# Patient Record
Sex: Female | Born: 1950
Health system: Southern US, Community
[De-identification: ages and names within clinical notes are randomized; demographics above are authoritative.]

## PROBLEM LIST (undated history)

## (undated) DIAGNOSIS — M329 Systemic lupus erythematosus, unspecified: Secondary | ICD-10-CM

## (undated) DIAGNOSIS — B977 Papillomavirus as the cause of diseases classified elsewhere: Secondary | ICD-10-CM

## (undated) DIAGNOSIS — M810 Age-related osteoporosis without current pathological fracture: Secondary | ICD-10-CM

## (undated) DIAGNOSIS — D649 Anemia, unspecified: Secondary | ICD-10-CM

## (undated) DIAGNOSIS — I10 Essential (primary) hypertension: Secondary | ICD-10-CM

## (undated) DIAGNOSIS — IMO0002 Reserved for concepts with insufficient information to code with codable children: Secondary | ICD-10-CM

## (undated) DIAGNOSIS — M1990 Unspecified osteoarthritis, unspecified site: Secondary | ICD-10-CM

## (undated) HISTORY — PX: CYSTOCELE REPAIR: SHX163

## (undated) HISTORY — DX: Papillomavirus as the cause of diseases classified elsewhere: B97.7

## (undated) HISTORY — DX: Anemia, unspecified: D64.9

## (undated) HISTORY — DX: Age-related osteoporosis without current pathological fracture: M81.0

---

## 1981-05-01 HISTORY — PX: TUBAL LIGATION: SHX77

## 1999-04-01 ENCOUNTER — Encounter: Payer: Self-pay | Admitting: Emergency Medicine

## 1999-04-01 ENCOUNTER — Emergency Department (HOSPITAL_COMMUNITY): Admission: EM | Admit: 1999-04-01 | Discharge: 1999-04-01 | Payer: Self-pay | Admitting: Emergency Medicine

## 2000-03-01 ENCOUNTER — Ambulatory Visit (HOSPITAL_COMMUNITY): Admission: RE | Admit: 2000-03-01 | Discharge: 2000-03-01 | Payer: Self-pay | Admitting: Pulmonary Disease

## 2000-03-01 ENCOUNTER — Encounter: Payer: Self-pay | Admitting: Pulmonary Disease

## 2000-10-23 ENCOUNTER — Emergency Department (HOSPITAL_COMMUNITY): Admission: EM | Admit: 2000-10-23 | Discharge: 2000-10-23 | Payer: Self-pay | Admitting: Emergency Medicine

## 2001-08-16 ENCOUNTER — Encounter: Payer: Self-pay | Admitting: Pulmonary Disease

## 2001-08-16 ENCOUNTER — Ambulatory Visit (HOSPITAL_COMMUNITY): Admission: RE | Admit: 2001-08-16 | Discharge: 2001-08-16 | Payer: Self-pay | Admitting: Pulmonary Disease

## 2001-10-24 ENCOUNTER — Encounter: Payer: Self-pay | Admitting: Orthopedic Surgery

## 2001-10-24 ENCOUNTER — Encounter: Admission: RE | Admit: 2001-10-24 | Discharge: 2001-10-24 | Payer: Self-pay | Admitting: Orthopedic Surgery

## 2003-05-02 HISTORY — PX: TOTAL HIP ARTHROPLASTY: SHX124

## 2003-09-23 ENCOUNTER — Inpatient Hospital Stay (HOSPITAL_COMMUNITY): Admission: RE | Admit: 2003-09-23 | Discharge: 2003-09-27 | Payer: Self-pay | Admitting: Orthopedic Surgery

## 2004-01-04 ENCOUNTER — Emergency Department (HOSPITAL_COMMUNITY): Admission: EM | Admit: 2004-01-04 | Discharge: 2004-01-04 | Payer: Self-pay | Admitting: Family Medicine

## 2005-08-22 ENCOUNTER — Emergency Department (HOSPITAL_COMMUNITY): Admission: EM | Admit: 2005-08-22 | Discharge: 2005-08-22 | Payer: Self-pay | Admitting: Family Medicine

## 2006-07-17 ENCOUNTER — Other Ambulatory Visit: Admission: RE | Admit: 2006-07-17 | Discharge: 2006-07-17 | Payer: Self-pay | Admitting: Obstetrics and Gynecology

## 2007-08-23 ENCOUNTER — Emergency Department (HOSPITAL_COMMUNITY): Admission: EM | Admit: 2007-08-23 | Discharge: 2007-08-23 | Payer: Self-pay | Admitting: Emergency Medicine

## 2008-03-17 ENCOUNTER — Emergency Department (HOSPITAL_COMMUNITY): Admission: EM | Admit: 2008-03-17 | Discharge: 2008-03-17 | Payer: Self-pay | Admitting: Emergency Medicine

## 2008-08-29 ENCOUNTER — Emergency Department (HOSPITAL_COMMUNITY): Admission: EM | Admit: 2008-08-29 | Discharge: 2008-08-30 | Payer: Self-pay | Admitting: Emergency Medicine

## 2008-09-09 ENCOUNTER — Ambulatory Visit (HOSPITAL_COMMUNITY): Admission: RE | Admit: 2008-09-09 | Discharge: 2008-09-09 | Payer: Self-pay | Admitting: Pulmonary Disease

## 2008-09-18 ENCOUNTER — Ambulatory Visit (HOSPITAL_COMMUNITY): Admission: RE | Admit: 2008-09-18 | Discharge: 2008-09-18 | Payer: Self-pay | Admitting: Pulmonary Disease

## 2008-11-09 ENCOUNTER — Ambulatory Visit (HOSPITAL_COMMUNITY): Admission: RE | Admit: 2008-11-09 | Discharge: 2008-11-09 | Payer: Self-pay | Admitting: Pulmonary Disease

## 2010-08-09 LAB — URINALYSIS, ROUTINE W REFLEX MICROSCOPIC
Bilirubin Urine: NEGATIVE
Glucose, UA: NEGATIVE mg/dL
Hgb urine dipstick: NEGATIVE
Ketones, ur: NEGATIVE mg/dL
Leukocytes, UA: NEGATIVE
Protein, ur: 30 mg/dL — AB
Specific Gravity, Urine: 1.021 (ref 1.005–1.030)

## 2010-08-09 LAB — CBC
HCT: 32.3 % — ABNORMAL LOW (ref 36.0–46.0)
Hemoglobin: 10.7 g/dL — ABNORMAL LOW (ref 12.0–15.0)
MCHC: 33.3 g/dL (ref 30.0–36.0)
MCV: 89.2 fL (ref 78.0–100.0)
Platelets: 243 10*3/uL (ref 150–400)
RBC: 3.62 MIL/uL — ABNORMAL LOW (ref 3.87–5.11)
RDW: 14.6 % (ref 11.5–15.5)
WBC: 10.8 10*3/uL — ABNORMAL HIGH (ref 4.0–10.5)

## 2010-08-09 LAB — POCT CARDIAC MARKERS
CKMB, poc: <1 ng/mL — ABNORMAL LOW (ref 1.0–8.0)
CKMB, poc: <1 ng/mL — ABNORMAL LOW (ref 1.0–8.0)
CKMB, poc: <1 ng/mL — ABNORMAL LOW (ref 1.0–8.0)
Myoglobin, poc: 45.5 ng/mL (ref 12–200)
Myoglobin, poc: 47.3 ng/mL (ref 12–200)
Troponin i, poc: <0.05 ng/mL (ref 0.00–0.09)
Troponin i, poc: <0.05 ng/mL (ref 0.00–0.09)

## 2010-08-09 LAB — DIFFERENTIAL
Basophils Absolute: 0 10*3/uL (ref 0.0–0.1)
Basophils Relative: 0 % (ref 0–1)
Eosinophils Absolute: 0 10*3/uL (ref 0.0–0.7)
Eosinophils Relative: 0 % (ref 0–5)
Lymphocytes Relative: 11 % — ABNORMAL LOW (ref 12–46)
Lymphs Abs: 1.1 10*3/uL (ref 0.7–4.0)
Monocytes Absolute: 0.7 10*3/uL (ref 0.1–1.0)
Monocytes Relative: 7 % (ref 3–12)
Neutro Abs: 8.9 10*3/uL — ABNORMAL HIGH (ref 1.7–7.7)
Neutrophils Relative %: 82 % — ABNORMAL HIGH (ref 43–77)

## 2010-08-09 LAB — COMPREHENSIVE METABOLIC PANEL
AST: 18 U/L (ref 0–37)
Albumin: 3.6 g/dL (ref 3.5–5.2)
Alkaline Phosphatase: 54 U/L (ref 39–117)
Calcium: 8.9 mg/dL (ref 8.4–10.5)
Chloride: 102 mEq/L (ref 96–112)
GFR calc non Af Amer: 48 mL/min — ABNORMAL LOW (ref >60)
Glucose, Bld: 98 mg/dL (ref 70–99)
Potassium: 3.6 mEq/L (ref 3.5–5.1)
Sodium: 138 mEq/L (ref 135–145)

## 2010-08-09 LAB — D-DIMER, QUANTITATIVE: D-Dimer, Quant: 1.29 ug/mL-FEU — ABNORMAL HIGH (ref 0.00–0.48)

## 2010-08-09 LAB — LIPASE, BLOOD: Lipase: 28 U/L (ref 11–59)

## 2010-09-16 NOTE — Op Note (Signed)
NAMEWENDIE, Lori Riley                        ACCOUNT NO.:  [id number]   MEDICAL RECORD NO.:  [id number]                   PATIENT TYPE:  INP   LOCATION:  5027                                 FACILITY:  [facility]   PHYSICIAN:  Robert A. Thurston Hole, M.D.              DATE OF BIRTH:  08/21/1950   DATE OF PROCEDURE:  09/23/2003  DATE OF DISCHARGE:                                 OPERATIVE REPORT   PREOPERATIVE DIAGNOSIS:  Right hip degenerative joint disease/lupus.   POSTOPERATIVE DIAGNOSIS:  Right hip degenerative joint disease/lupus.   PROCEDURE:  Right total hip replacement using Osteonix ceramic press fit  total hip system with acetabulum 52 press fit Trident acetabulum with two  locking screws and ceramic insert.  Femoral component 7 press fit Secure-Fit  Plus with +0 by 32 mm ceramic head and 13 mm distal tip.   SURGEON:  Elana Alm. Thurston Hole, M.D.   ASSISTANT:  Julien Girt, P.A.   ANESTHESIA:  General.   OPERATIVE TIME:  1 hour 15 minutes.   ESTIMATED BLOOD LOSS:  350 mL.   COMPLICATIONS:  None.   DESCRIPTION OF PROCEDURE:  Ms. Shankman was brought to the operating room on  Sep 23, 2003, and placed on the operating table in supine position.  She  received Ancef 1 gram IV preoperatively for prophylaxis.  After being placed  under general anesthesia, she had a Foley catheter placed under sterile  conditions.  Her right hip was examined.  She had flexion to 90, extension  to 0, internal and external rotation of 20 degrees.  Leg lengths were  approximately equal.  Pulses were 2+ and symmetric.  After being placed  under general anesthesia, she was then turned to the right lateral up  decubitus position and secured on the bed with a Mark frame.  Her right hip  and leg was then prepped with DuraPrep and draped using sterile technique.   Initially, through a 15 cm posterolateral greater trochanteric incision  initial exposure was made.  The underlying subcutaneous tissues were  incised  in line with the skin incision.  The iliotibial band and gluteus maximus  fascia was incised longitudinally revealing the underlying sciatic nerve  which was carefully protected.  The short external rotators of the hip and  hip capsule were released off the femoral neck insertion and tagged.  The  hip was then posteriorly dislocated.  She was found to have significant  grade 3 and 4 chondromalacia of the femoral head and the acetabulum.  A  femoral neck cut was made 1.5 to 2 cm above the lesser trochanter in the  appropriate amount of anteversion and inclination.  The acetabulum was then  carefully exposed with retractors.  Degenerative labrum was removed.  After  this was done, sequential acetabular reaming was carried in the appropriate  amount of anteversion and abduction and inclination up to a 52 mm size which  was found  to be an excellent fit and then a 52 mm trial cup was placed and  that also gave an excellent fit.  After this was removed, then the actual 52  mm Trident press fit PSL cup was hammered into position in the appropriate  amount of anteversion and abduction and inclination with an excellent press  fit.  It was further secured in place with two separate locking screws, one  at the 12 o'clock and one at the 11 o'clock position.  After this was done,  then the ceramic insert was placed in position and locked in place with an  excellent Morris taper fit.  After this was done, the proximal femur was  exposed.  Sequential reaming was carried out up to a #7 size with axial  reamers followed by broaches to a #7 broach and with a #7 broach in place  and a +0 by 32 mm trial head, this was found to give an excellent fit.  The  hip was reduced and taken through a full range of motion and found to be  stable and leg length equal.  This was then removed.  Distal reaming was  carried out to a 13.5 mm size.  After this was done, then the actual #7  Secure-Fit Plus prosthesis  with a 13 mm distal tip was hammered into  position with an excellent press fit.  A +0 by 32 mm ceramic head was placed  on the femoral neck and hammered into position with an excellent Morris  taper fit.  The hip was then reduced, taken through a full range of motion,  and found to be stable and leg lengths equal.  At this point, it was felt  that all the components were of excellent size, fit, and stability.  The hip  wound was further irrigated with saline solution.  The short external  rotators of the hip and hip capsule were then reattached to their femoral  neck insertion through two drill holes in the greater trochanter.  The  iliotibial band and gluteus maximus fascia was closed with #1 Ethibond  suture.  The subcutaneous tissues were closed with 0 and 2-0 Vicryl.  The  skin was closed with skin staples.  Sterile dressings were applied.  A hip  abduction pillow was applied.  The patient was then turned supine and  checked for leg lengths which were equal, rotation was equal, and pulses  were 2+ and symmetric.  She was then awakened, extubated, and taken to the  recovery room in stable condition.  Needle and sponge counts were correct x  2 at the end of the case.                                               Robert A. Thurston Hole, M.D.    RAW/MEDQ  D:  09/23/2003  T:  09/23/2003  Job:  [id number]

## 2010-09-16 NOTE — Discharge Summary (Signed)
Lori Riley, Lori Riley                        ACCOUNT NO.:  [id number]   MEDICAL RECORD NO.:  [id number]                   PATIENT TYPE:  INP   LOCATION:  5027                                 FACILITY:  [facility]   PHYSICIAN:  Robert A. Thurston Hole, M.D.              DATE OF BIRTH:  Oct 03, 1950   DATE OF ADMISSION:  09/23/2003  DATE OF DISCHARGE:  09/27/2003                                 DISCHARGE SUMMARY   ADMITTING DIAGNOSES:  1. End-stage degenerative joint disease right hip.  2. Lupus.  3. Osteoarthritis.   DISCHARGE DIAGNOSES:  1. End-stage degenerative joint disease right hip.  2. Lupus.  3. Osteoarthritis.  4. Constipation.   HISTORY OF PRESENT ILLNESS:  The patient is a 60 year old white female who  has history of end-stage DJD of both hips secondary to her lupus.  Her right  is more painful than her left.  She has elected to undergo a right total hip  replacement.  She understands the risks, benefits, and possible  complications of it and is without question.   PROCEDURES IN-HOUSE:  On Sep 23, 2003 the patient underwent a right total  hip replacement by Dr. Thurston Hole.  She tolerated the procedure well.   She was admitted postoperatively for pain control, DVT prophylaxis, and  physical therapy.  Postoperative day #2 hemoglobin was 12.1, her INR was  1.1.  Her UA did show bacteria in her urine.  She was started on Cipro 500  mg once a day.  She is touchdown weightbearing.  Her PCA was discontinued.  Physical therapy was commenced.  Postoperative day #2 the patient progressed  well with physical therapy, touchdown weightbearing.  Her hemoglobin was  10.6.  She was metabolically stable.  Her surgical wound was well  approximated.  Her IV was discontinued.  Postoperative day #3 hemoglobin was  9.8, her temperature was 101.2.  She had a slight amount of serous drainage.  Her pain is well controlled.  Postoperative day #4 the patient was doing  very well.  Tmax was 99.9.  Her INR was  2.4.  Her surgical wound was well  approximated with slight serous drainage.  She had a BM.  She was discharged  home in stable condition, touchdown weightbearing, on a regular diet, on K-  Dur 20 mEq one p.o. b.i.d. x1 week, Percocet 5/325 one to two q.4-6h. p.r.n.  pain, Robaxin 500 mg one tablet q.6h. p.r.n. muscle spasm, Coumadin 2.5 mg  one tablet daily, prednisone 5 mg one tablet daily, Colace 100 mg one tablet  twice daily.  Senokot-S two tablets before dinner.  She has been instructed  to call with increased redness, increased drainage, increased pain, or a  temperature greater than 101.  She is to continue on her Cipro 500 mg one  p.o. b.i.d. x3 more days - she has this at home.      Kirstin Shepperson, P.A.  Robert A. Thurston Hole, M.D.    KS/MEDQ  D:  11/24/2003  T:  11/24/2003  Job:  [id number]

## 2010-10-31 ENCOUNTER — Inpatient Hospital Stay (INDEPENDENT_AMBULATORY_CARE_PROVIDER_SITE_OTHER)
Admission: RE | Admit: 2010-10-31 | Discharge: 2010-10-31 | Disposition: A | Discharge status: Home / Self Care | Payer: BC Managed Care – PPO | Source: Ambulatory Visit | Attending: Family Medicine | Admitting: Family Medicine

## 2010-10-31 DIAGNOSIS — L03019 Cellulitis of unspecified finger: Secondary | ICD-10-CM

## 2010-11-07 IMAGING — CT CT ANGIO CHEST
2 of 6 series · 19 of 46 positions shown · IV contrast (APPLIED)
Comparison: None

CLINICAL DATA: Right-sided chest pain.  Shortness of breath.
Elevated D-dimer.

CT ANGIOGRAPHY CHEST WITH CONTRAST
TECHNIQUE: Multidetector CT imaging of the chest was performed
using the standard protocol during bolus administration of
intravenous contrast. Multiplanar CT image reconstructions
including MIPs were obtained to evaluate the vascular anatomy.
Contrast: 80 ml [id number]-[id number]

[Series 6: thins · axial · 0.57mm/px · z∈[-254,-46]mm · 16 of 228 slices shown]
[im 10/228  lung]
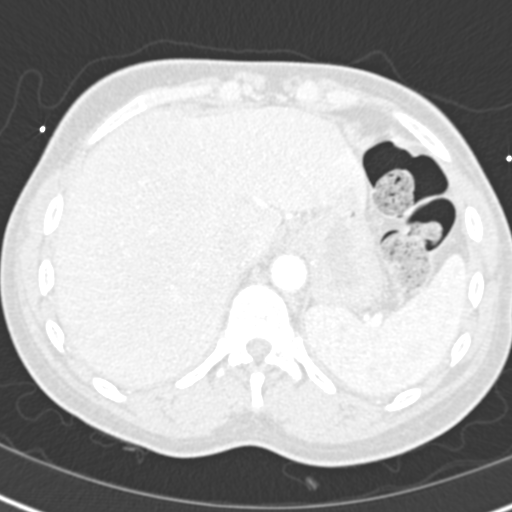
[im 30/228  soft-tissue]
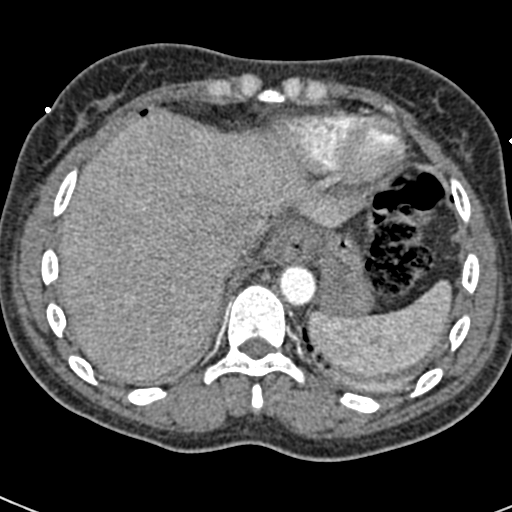
[im 40/228  lung]
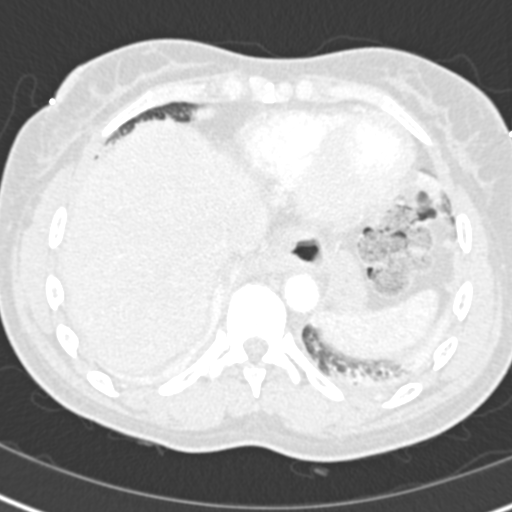
[im 50/228  soft-tissue]
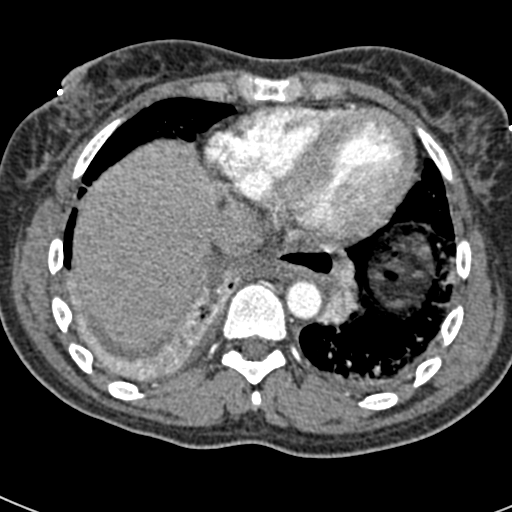
[im 70/228  lung]
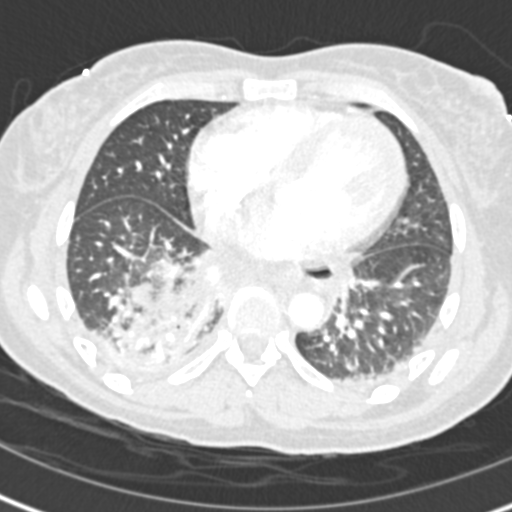
[im 79/228  soft-tissue]
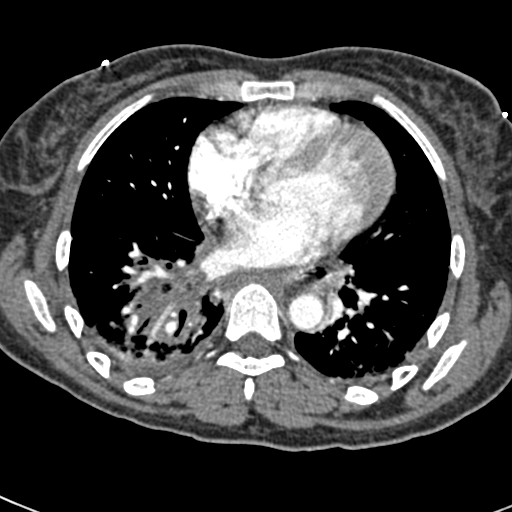
[im 89/228  lung]
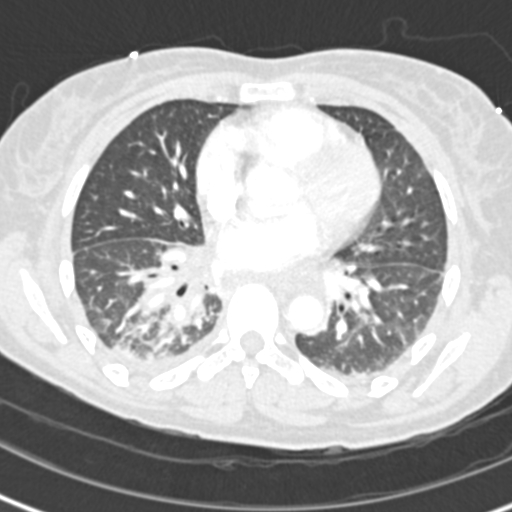
[im 109/228  soft-tissue]
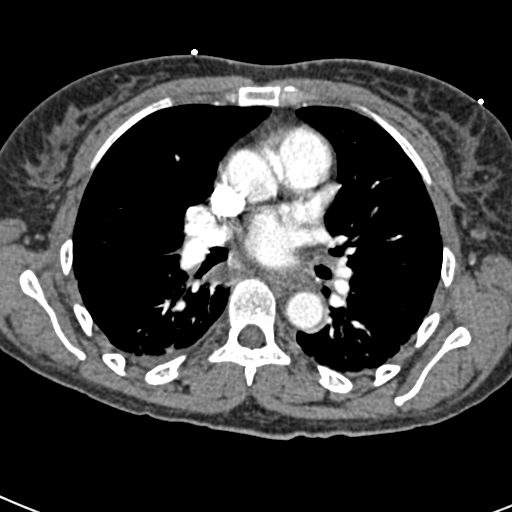
[im 119/228  lung]
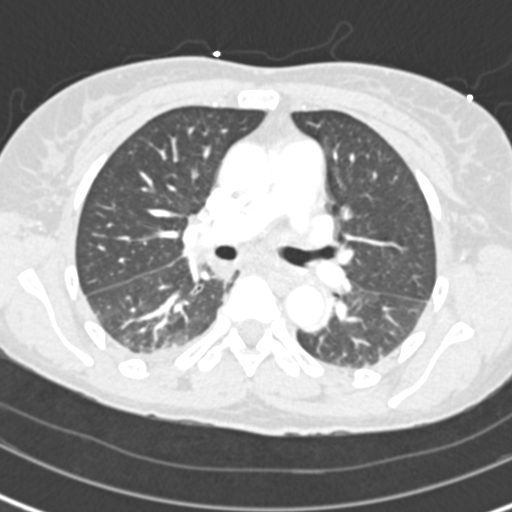
[im 139/228  soft-tissue]
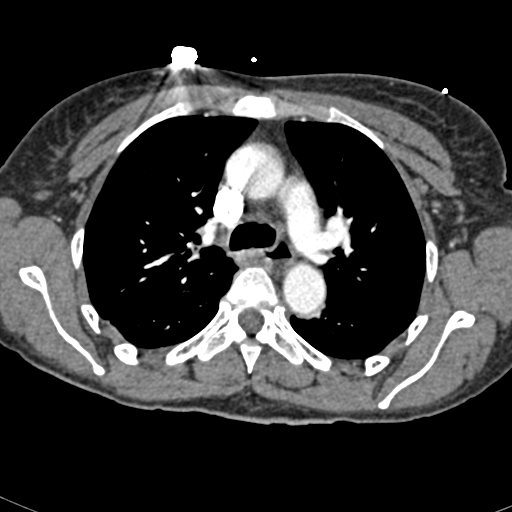
[im 149/228  lung]
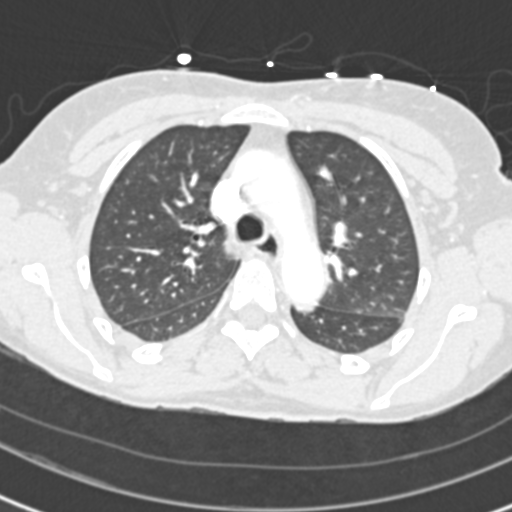
[im 158/228  soft-tissue]
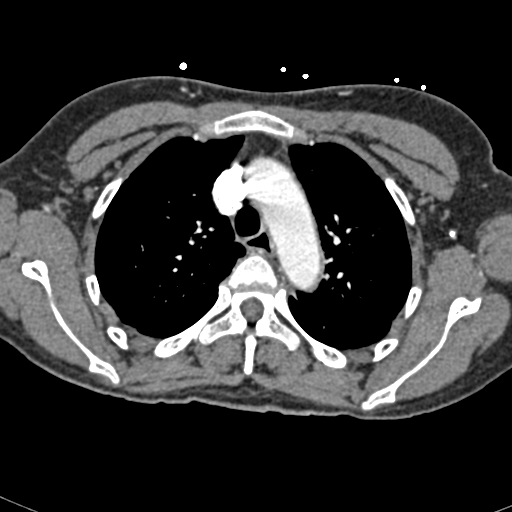
[im 178/228  lung]
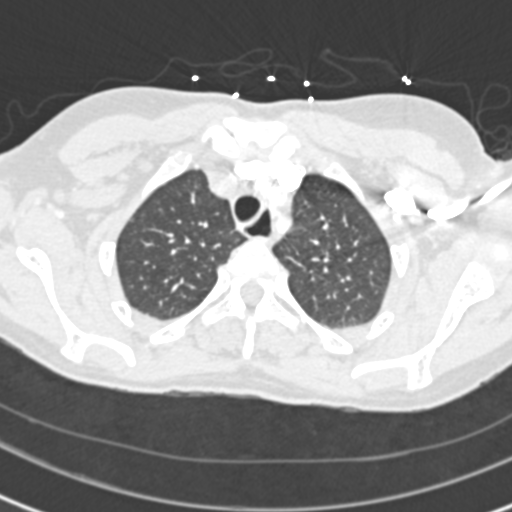
[im 188/228  soft-tissue]
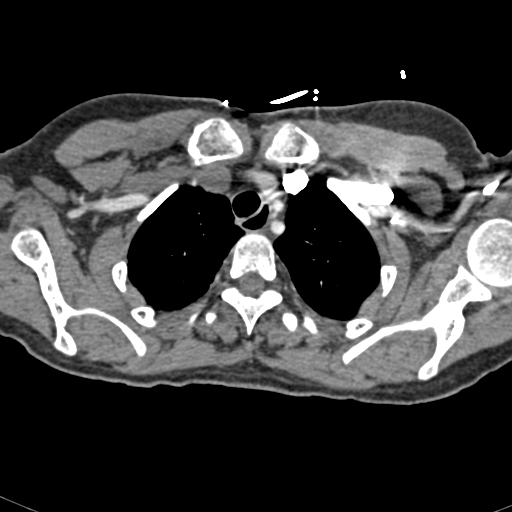
[im 198/228  lung]
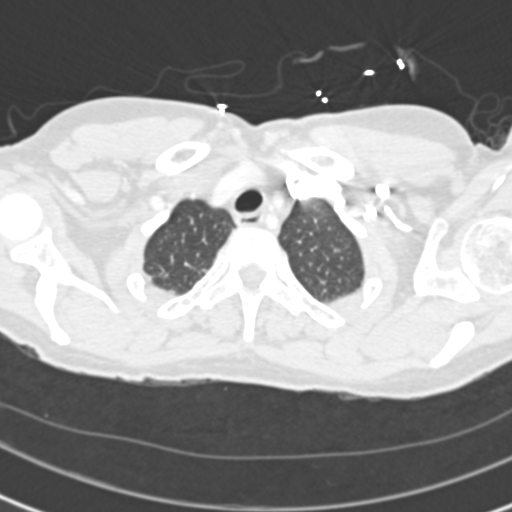
[im 218/228  soft-tissue]
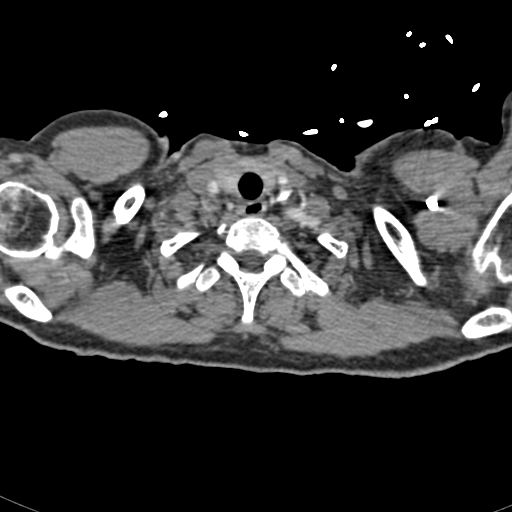

[Series 602: coronal chest · coronal · 0.57mm/px · 3 of 90 slices shown]
[im 23/90  soft-tissue]
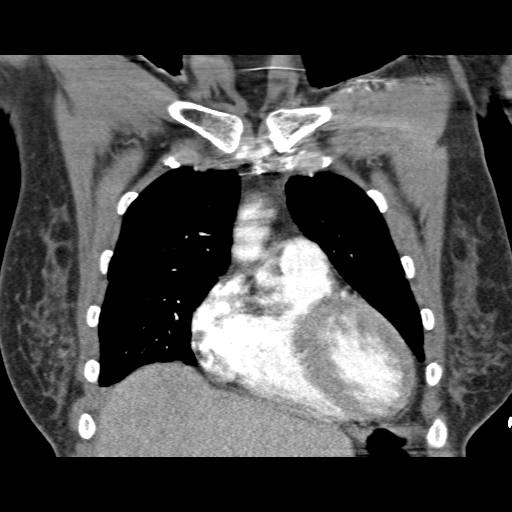
[im 45/90  soft-tissue]
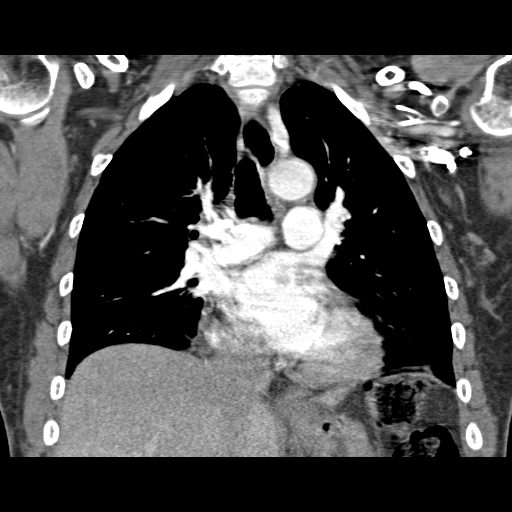
[im 67/90  soft-tissue]
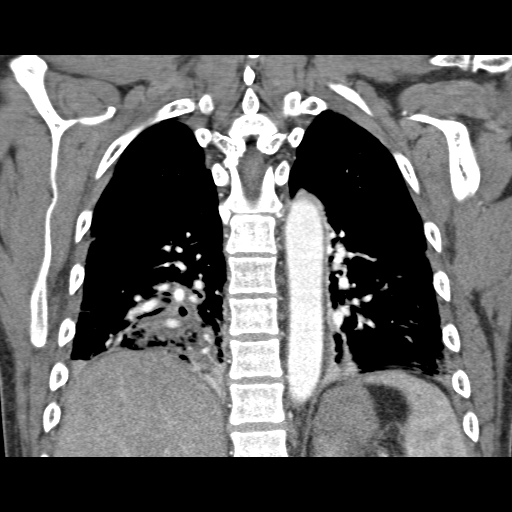

[19 of 46 positions shown; findings below may reference images not displayed]

FINDINGS: Satisfactory opacification of pulmonary arteries is
seen, and there is no evidence of acute pulmonary emboli.  There is
no evidence of thoracic aortic aneurysm or dissection.

Confluent infiltrate is seen in the right lower lobe with central
air bronchograms, suspicious for pneumonia.  There is no evidence
of centrally obstructing mass or lymphadenopathy.  Tiny pleural
effusions are noted bilaterally.  A small hiatal hernia is also
noted.

Review of the MIP images confirms the above findings.
IMPRESSION: 1.  No evidence of acute pulmonary embolism.
2.  Confluent right lower lobe infiltrate, suspicious for
pneumonia.  Post-treatment radiographic followup is recommended to
confirm resolution.
3.  Tiny bilateral pleural effusions.
4.  Small hiatal hernia.

## 2010-11-15 ENCOUNTER — Other Ambulatory Visit (HOSPITAL_COMMUNITY): Payer: Self-pay | Admitting: Pulmonary Disease

## 2010-11-15 DIAGNOSIS — N289 Disorder of kidney and ureter, unspecified: Secondary | ICD-10-CM

## 2010-11-17 ENCOUNTER — Ambulatory Visit (HOSPITAL_COMMUNITY)
Admission: RE | Admit: 2010-11-17 | Discharge: 2010-11-17 | Disposition: A | Discharge status: Home / Self Care | Payer: BC Managed Care – PPO | Source: Ambulatory Visit | Attending: Pulmonary Disease | Admitting: Pulmonary Disease

## 2010-11-17 DIAGNOSIS — R319 Hematuria, unspecified: Secondary | ICD-10-CM | POA: Insufficient documentation

## 2010-11-17 DIAGNOSIS — N289 Disorder of kidney and ureter, unspecified: Secondary | ICD-10-CM | POA: Insufficient documentation

## 2010-12-28 ENCOUNTER — Other Ambulatory Visit: Payer: Self-pay | Admitting: Pulmonary Disease

## 2010-12-28 DIAGNOSIS — M169 Osteoarthritis of hip, unspecified: Secondary | ICD-10-CM

## 2010-12-29 ENCOUNTER — Ambulatory Visit
Admission: RE | Admit: 2010-12-29 | Discharge: 2010-12-29 | Disposition: A | Discharge status: Home / Self Care | Payer: BC Managed Care – PPO | Source: Ambulatory Visit | Attending: Pulmonary Disease | Admitting: Pulmonary Disease

## 2010-12-29 DIAGNOSIS — M169 Osteoarthritis of hip, unspecified: Secondary | ICD-10-CM

## 2010-12-29 MED ORDER — IOHEXOL 180 MG/ML  SOLN
1.0000 mL | Freq: Once | INTRAMUSCULAR | Status: AC | PRN
  Administered 2010-12-29: 1 mL via INTRA_ARTICULAR

## 2010-12-29 MED ORDER — METHYLPREDNISOLONE ACETATE 40 MG/ML INJ SUSP (RADIOLOG
120.0000 mg | Freq: Once | INTRAMUSCULAR | Status: AC
  Administered 2010-12-29: 120 mg via INTRA_ARTICULAR

## 2011-02-01 LAB — COMPREHENSIVE METABOLIC PANEL
Albumin: 3.5
BUN: 13
Calcium: 9.2
Creatinine, Ser: 0.83
GFR calc Af Amer: >60
Total Bilirubin: 0.8
Total Protein: 7.4

## 2011-02-01 LAB — URINALYSIS, ROUTINE W REFLEX MICROSCOPIC
Glucose, UA: NEGATIVE
Hgb urine dipstick: NEGATIVE
Ketones, ur: NEGATIVE
Nitrite: NEGATIVE
Protein, ur: 30 — AB
Urobilinogen, UA: 1
pH: 6

## 2011-02-01 LAB — CBC
HCT: 32.3 — ABNORMAL LOW
MCV: 86
Platelets: 257
RDW: 17.3 — ABNORMAL HIGH

## 2011-02-01 LAB — DIFFERENTIAL
Basophils Absolute: 0.1
Basophils Relative: 2 — ABNORMAL HIGH
Eosinophils Relative: 1
Lymphocytes Relative: 34
Monocytes Relative: 18 — ABNORMAL HIGH
Neutro Abs: 1.9

## 2011-02-20 ENCOUNTER — Other Ambulatory Visit (HOSPITAL_COMMUNITY): Payer: BC Managed Care – PPO

## 2011-02-22 ENCOUNTER — Encounter (HOSPITAL_COMMUNITY)
Admission: RE | Admit: 2011-02-22 | Discharge: 2011-02-22 | Disposition: A | Discharge status: Home / Self Care | Payer: BC Managed Care – PPO | Source: Ambulatory Visit | Attending: Orthopedic Surgery | Admitting: Orthopedic Surgery

## 2011-02-22 ENCOUNTER — Ambulatory Visit (HOSPITAL_COMMUNITY)
Admission: RE | Admit: 2011-02-22 | Discharge: 2011-02-22 | Disposition: A | Discharge status: Home / Self Care | Payer: BC Managed Care – PPO | Source: Ambulatory Visit | Attending: Orthopedic Surgery | Admitting: Orthopedic Surgery

## 2011-02-22 ENCOUNTER — Other Ambulatory Visit (HOSPITAL_COMMUNITY): Payer: Self-pay | Admitting: Orthopedic Surgery

## 2011-02-22 DIAGNOSIS — Z01812 Encounter for preprocedural laboratory examination: Secondary | ICD-10-CM | POA: Insufficient documentation

## 2011-02-22 DIAGNOSIS — Z96649 Presence of unspecified artificial hip joint: Secondary | ICD-10-CM

## 2011-02-22 DIAGNOSIS — Z01818 Encounter for other preprocedural examination: Secondary | ICD-10-CM | POA: Insufficient documentation

## 2011-02-22 LAB — PROTIME-INR: INR: 0.93 (ref 0.00–1.49)

## 2011-02-22 LAB — DIFFERENTIAL
Basophils Absolute: 0 10*3/uL (ref 0.0–0.1)
Lymphocytes Relative: 30 % (ref 12–46)
Lymphs Abs: 1.3 10*3/uL (ref 0.7–4.0)
Monocytes Absolute: 0.7 10*3/uL (ref 0.1–1.0)
Neutro Abs: 2.2 10*3/uL (ref 1.7–7.7)

## 2011-02-22 LAB — SURGICAL PCR SCREEN
MRSA, PCR: NEGATIVE
Staphylococcus aureus: NEGATIVE

## 2011-02-22 LAB — BASIC METABOLIC PANEL
GFR calc Af Amer: 64 mL/min — ABNORMAL LOW (ref >90)
GFR calc non Af Amer: 55 mL/min — ABNORMAL LOW (ref >90)
Potassium: 3.4 mEq/L — ABNORMAL LOW (ref 3.5–5.1)
Sodium: 143 mEq/L (ref 135–145)

## 2011-02-22 LAB — URINALYSIS, ROUTINE W REFLEX MICROSCOPIC
Hgb urine dipstick: NEGATIVE
Nitrite: NEGATIVE
Specific Gravity, Urine: 1.015 (ref 1.005–1.030)
Urobilinogen, UA: 1 mg/dL (ref 0.0–1.0)

## 2011-02-22 LAB — APTT: aPTT: 33 seconds (ref 24–37)

## 2011-02-22 LAB — TYPE AND SCREEN: ABO/RH(D): B POS

## 2011-02-22 LAB — CBC
HCT: 33.3 % — ABNORMAL LOW (ref 36.0–46.0)
Hemoglobin: 11 g/dL — ABNORMAL LOW (ref 12.0–15.0)
MCHC: 33 g/dL (ref 30.0–36.0)

## 2011-03-01 ENCOUNTER — Inpatient Hospital Stay (HOSPITAL_COMMUNITY)
Admission: RE | Admit: 2011-03-01 | Discharge: 2011-03-04 | DRG: 818 | Disposition: A | Discharge status: Home / Self Care | Payer: BC Managed Care – PPO | Source: Ambulatory Visit | Attending: Orthopedic Surgery | Admitting: Orthopedic Surgery

## 2011-03-01 ENCOUNTER — Inpatient Hospital Stay (HOSPITAL_COMMUNITY): Payer: BC Managed Care – PPO

## 2011-03-01 DIAGNOSIS — Z01810 Encounter for preprocedural cardiovascular examination: Secondary | ICD-10-CM

## 2011-03-01 DIAGNOSIS — Z79899 Other long term (current) drug therapy: Secondary | ICD-10-CM

## 2011-03-01 DIAGNOSIS — Z01818 Encounter for other preprocedural examination: Secondary | ICD-10-CM

## 2011-03-01 DIAGNOSIS — E7800 Pure hypercholesterolemia, unspecified: Secondary | ICD-10-CM | POA: Diagnosis present

## 2011-03-01 DIAGNOSIS — IMO0002 Reserved for concepts with insufficient information to code with codable children: Secondary | ICD-10-CM

## 2011-03-01 DIAGNOSIS — M169 Osteoarthritis of hip, unspecified: Principal | ICD-10-CM | POA: Diagnosis present

## 2011-03-01 DIAGNOSIS — M1610 Unilateral primary osteoarthritis, unspecified hip: Principal | ICD-10-CM | POA: Diagnosis present

## 2011-03-01 DIAGNOSIS — I10 Essential (primary) hypertension: Secondary | ICD-10-CM | POA: Diagnosis present

## 2011-03-01 DIAGNOSIS — Z01812 Encounter for preprocedural laboratory examination: Secondary | ICD-10-CM

## 2011-03-01 DIAGNOSIS — N058 Unspecified nephritic syndrome with other morphologic changes: Secondary | ICD-10-CM | POA: Diagnosis present

## 2011-03-01 DIAGNOSIS — M329 Systemic lupus erythematosus, unspecified: Secondary | ICD-10-CM | POA: Diagnosis present

## 2011-03-02 LAB — CBC
HCT: 32.1 % — ABNORMAL LOW (ref 36.0–46.0)
MCHC: 32.4 g/dL (ref 30.0–36.0)
MCV: 91.2 fL (ref 78.0–100.0)
RDW: 13.9 % (ref 11.5–15.5)

## 2011-03-02 LAB — BASIC METABOLIC PANEL
CO2: 27 mEq/L (ref 19–32)
Calcium: 8.9 mg/dL (ref 8.4–10.5)
Creatinine, Ser: 1.06 mg/dL (ref 0.50–1.10)
Glucose, Bld: 191 mg/dL — ABNORMAL HIGH (ref 70–99)
Sodium: 135 mEq/L (ref 135–145)

## 2011-03-03 LAB — CBC
HCT: 29.3 % — ABNORMAL LOW (ref 36.0–46.0)
MCH: 29.5 pg (ref 26.0–34.0)
MCV: 91 fL (ref 78.0–100.0)
Platelets: 186 10*3/uL (ref 150–400)
RBC: 3.22 MIL/uL — ABNORMAL LOW (ref 3.87–5.11)

## 2011-03-04 LAB — CBC
HCT: 29.9 % — ABNORMAL LOW (ref 36.0–46.0)
Hemoglobin: 9.8 g/dL — ABNORMAL LOW (ref 12.0–15.0)
MCV: 88.7 fL (ref 78.0–100.0)
RDW: 13.6 % (ref 11.5–15.5)
WBC: 8.6 10*3/uL (ref 4.0–10.5)

## 2011-03-04 LAB — PROTIME-INR: INR: 1.78 — ABNORMAL HIGH (ref 0.00–1.49)

## 2011-03-04 MED ORDER — ZOLPIDEM TARTRATE 5 MG PO TABS: 5.0000 mg | ORAL_TABLET | Freq: Every evening | ORAL | Status: DC | PRN

## 2011-03-04 MED ORDER — DIPHENHYDRAMINE HCL 25 MG PO CAPS: 25.0000 mg | ORAL_CAPSULE | ORAL | Status: DC | PRN

## 2011-03-04 MED ORDER — ACETAMINOPHEN 650 MG RE SUPP: 650.0000 mg | RECTAL | Status: DC | PRN

## 2011-03-04 MED ORDER — OLMESARTAN MEDOXOMIL 40 MG PO TABS
40.0000 mg | ORAL_TABLET | Freq: Every day | ORAL | Status: DC
  Filled 2011-03-04 (×2): qty 1

## 2011-03-04 MED ORDER — METOCLOPRAMIDE HCL 5 MG/ML IJ SOLN
5.0000 mg | Freq: Three times a day (TID) | INTRAMUSCULAR | Status: DC | PRN
  Filled 2011-03-04: qty 2

## 2011-03-04 MED ORDER — HYDROCHLOROTHIAZIDE 25 MG PO TABS
12.5000 mg | ORAL_TABLET | Freq: Every day | ORAL | Status: DC
  Filled 2011-03-04 (×2): qty 0.5

## 2011-03-04 MED ORDER — METOCLOPRAMIDE HCL 10 MG PO TABS: 5.0000 mg | ORAL_TABLET | Freq: Three times a day (TID) | ORAL | Status: DC | PRN

## 2011-03-04 MED ORDER — DEXTROSE-NACL 5-0.45 % IV SOLN: INTRAVENOUS | Status: DC

## 2011-03-04 MED ORDER — ALUM & MAG HYDROXIDE-SIMETH 200-200-20 MG/5ML PO SUSP: 30.0000 mL | ORAL | Status: DC | PRN

## 2011-03-04 MED ORDER — MENTHOL 3 MG MT LOZG: 1.0000 | LOZENGE | OROMUCOSAL | Status: DC | PRN

## 2011-03-04 MED ORDER — HYDROMORPHONE HCL PF 1 MG/ML IJ SOLN: 0.5000 mg | INTRAMUSCULAR | Status: DC | PRN

## 2011-03-04 MED ORDER — BISACODYL 5 MG PO TBEC: 10.0000 mg | DELAYED_RELEASE_TABLET | Freq: Every day | ORAL | Status: DC | PRN

## 2011-03-04 MED ORDER — ONDANSETRON HCL 4 MG/2ML IJ SOLN: 4.0000 mg | Freq: Four times a day (QID) | INTRAMUSCULAR | Status: DC | PRN

## 2011-03-04 MED ORDER — DOCUSATE SODIUM 100 MG PO CAPS: 100.0000 mg | ORAL_CAPSULE | Freq: Two times a day (BID) | ORAL | Status: DC

## 2011-03-04 MED ORDER — PHENOL 1.4 % MT LIQD
1.0000 | OROMUCOSAL | Status: DC | PRN
  Filled 2011-03-04: qty 177

## 2011-03-04 MED ORDER — VITAMIN D (ERGOCALCIFEROL) 1.25 MG (50000 UNIT) PO CAPS
50000.0000 [IU] | ORAL_CAPSULE | ORAL | Status: DC
  Filled 2011-03-04: qty 1

## 2011-03-04 MED ORDER — METHOCARBAMOL 100 MG/ML IJ SOLN
500.0000 mg | Freq: Four times a day (QID) | INTRAMUSCULAR | Status: DC | PRN
  Filled 2011-03-04: qty 5

## 2011-03-04 MED ORDER — ONDANSETRON HCL 4 MG PO TABS: 4.0000 mg | ORAL_TABLET | Freq: Four times a day (QID) | ORAL | Status: DC | PRN

## 2011-03-04 MED ORDER — SENNOSIDES-DOCUSATE SODIUM 8.6-50 MG PO TABS: 1.0000 | ORAL_TABLET | Freq: Every evening | ORAL | Status: DC | PRN

## 2011-03-04 MED ORDER — TEMAZEPAM 15 MG PO CAPS: 15.0000 mg | ORAL_CAPSULE | Freq: Every evening | ORAL | Status: DC | PRN

## 2011-03-04 MED ORDER — ALLOPURINOL 100 MG PO TABS
100.0000 mg | ORAL_TABLET | Freq: Every day | ORAL | Status: DC
  Filled 2011-03-04: qty 1

## 2011-03-04 MED ORDER — BISACODYL 10 MG RE SUPP: 10.0000 mg | Freq: Every day | RECTAL | Status: DC | PRN

## 2011-03-04 MED ORDER — FLEET ENEMA 7-19 GM/118ML RE ENEM: 1.0000 | ENEMA | Freq: Every day | RECTAL | Status: DC | PRN

## 2011-03-04 MED ORDER — ALUMINUM HYDROXIDE GEL 320 MG/5ML PO SUSP
15.0000 mL | ORAL | Status: DC | PRN
  Filled 2011-03-04: qty 30

## 2011-03-04 MED ORDER — OXYCODONE HCL 5 MG PO TABS: 5.0000 mg | ORAL_TABLET | ORAL | Status: DC | PRN

## 2011-03-04 MED ORDER — PREDNISONE 10 MG PO TABS
10.0000 mg | ORAL_TABLET | Freq: Every day | ORAL | Status: DC
  Filled 2011-03-04 (×2): qty 1

## 2011-03-04 MED ORDER — AMLODIPINE BESYLATE 5 MG PO TABS
5.0000 mg | ORAL_TABLET | Freq: Every day | ORAL | Status: DC
  Filled 2011-03-04 (×2): qty 1

## 2011-03-04 MED ORDER — HYDROCODONE-ACETAMINOPHEN 5-325 MG PO TABS: 1.5000 | ORAL_TABLET | ORAL | Status: DC | PRN

## 2011-03-04 MED ORDER — METHOCARBAMOL 500 MG PO TABS: 500.0000 mg | ORAL_TABLET | Freq: Four times a day (QID) | ORAL | Status: DC | PRN

## 2011-03-04 MED ORDER — ACETAMINOPHEN 325 MG PO TABS: 325.0000 mg | ORAL_TABLET | ORAL | Status: DC | PRN

## 2011-03-04 NOTE — Plan of Care (Signed)
Problem: Consults Goal: Diagnosis- Total Joint Replacement Outcome: Completed in [facility] Date Met:  03/04/11 Primary Total Hip   Primary Total Hip Replacement  Problem: Phase II Progression Outcomes Goal: Other Phase II Outcomes/Goals Outcome: Completed in [facility] Date Met:  03/04/11 IS/TCDB reviewed. Total Hip precautions, proper positioning, antiembolic exercises discussed. Patient information guide given. Pain management, safety, and Fall risk discussed.

## 2011-03-06 NOTE — Op Note (Signed)
NAMEMARKIA, Lori Riley NO.:  [id number]  MEDICAL RECORD NO.:  [id number]  LOCATION:  5006                         FACILITY:  [facility]  PHYSICIAN:  Feliberto Gottron. Turner Daniels, M.D.   DATE OF BIRTH:  [birth year]  DATE OF PROCEDURE:  03/01/2011 DATE OF DISCHARGE:                              OPERATIVE REPORT   PREOPERATIVE DIAGNOSIS:  End-stage arthritis of the left hip.  POSTOPERATIVE DIAGNOSIS:  End-stage arthritis of the left hip.  PROCEDURE:  Left total hip arthroplasty using a 52-mm DePuy Pinnacle Shell.  Apex Hole Eliminator, 10-degree polyethylene liner, 20 x 50 x 42 x 165 x 42 S-ROM stem, 20 D small cone, +0 36 mm Zirconia head.  ESTIMATED BLOOD LOSS:  300 mL.  FLUID REPLACEMENT:  800 mL crystalloid.  DRAINS PLACED:  Foley catheter.  URINE OUTPUT:  300 mL.  INDICATIONS FOR PROCEDURE:  A 60 year old woman with end-stage arthritis of the left hip, bone-on-bone, with severe disabling pain in night and day.  Asked to ambulate with a cane, has started by using a wheelchair and anti-inflammatory medicines.  Narcotics did not help with her pain and she is miserable.  She had a successful right total hip arthroplasty 5 years ago and very much desires left total hip arthroplasty.  Risks and benefits of surgery discussed, questions answered.  DESCRIPTION OF PROCEDURE:  The patient was identified by armband and received preoperative IV antibiotics in the holding area at [facility], taken to operating room #4, appropriate anesthetic monitors were attached and general endotracheal anesthesia induced.  The patient in supine position, Foley catheter was inserted.  She was rolled into the right lateral decubitus position and fixed there with a Stulberg Mark II pelvic clamp  and the left lower extremity was then prepped and draped in sterile fashion from the ankle to the hemipelvis.  A time-out procedure performed.  The skin along the lateral hip and thigh infiltrated  with 20 mL of 0.5% Marcaine and epinephrine solution and a 20-cm incision was made over the greater trochanter allowing posterolateral approach to the hip.  Small bleeders in the skin and subcutaneous tissue identified and cauterized.  IT band cut in line with skin incision exposing the greater trochanter.  Cobra retractors were placed between the gluteus minimus and the superior hip joint capsule, quadratus femoris and the inferior hip joint capsule.  Short external rotators and piriformis were then isolated, tagged with #2 Ethibond suture and cut off their insertion on the intertrochanteric crest.  This exposed the hip joint capsule, which was then developed into an acetabular-based flap going from posterior-superior to posterior- inferior and out over the femoral neck.  The flap was tagged with two #2 Ethibond sutures and retracted exposing the arthritic femoral head.  The hip was flexed and internally rotated, dislocating the hip and exposing the femoral neck completely, which was down to bare-bone along the weightbearing region.  At this time, I performed a standard neck cut 1 fingerbreadth above the lesser trochanter and translated the proximal femur anteriorly levering off the anterior column with a Hohmann retractor.  A spike cover was then placed in cotyloid notch.  A wing retractor at the junction of  the ischium and acetabulum allowing [place] to excise the labrum.  We then reamed up to a 51 mm basket reamer obtaining good coverage in all quadrants.  Satisfied with the reaming.  The wound was then irrigated out with normal saline solution and hammered into place a 52-mm DuPey Pinnacle Shell, 45 degrees of abduction, 20 degrees of anteversion, placed a central occluder and hammered into place a 10- degree polyethylene liner index posterior and superior.  The shell was quite stable and we can almost lift the patient off the table with the shell handle prior to insertion of the liner.   Hip was then flexed and internally rotated exposing the proximal femur, which was entered with the box-cutting chisel.  Initiating reamer and then reamed with the 8, 10, 12, 13 and 14 mm axial reamers.  At 145, we started getting cortical chatter.  We reamed up to 155 and 0.5 mm increments and then reamed halfway down with a 16 mm reamer.  We then conically reamed up to a 20-D cone to the appropriate depth for a 42-base neck and then milled the calcar to a 20-D small calcar.  A 20-D small trial cone was hammered in place followed by trial stem with a +0 36 mm ball in about 10 degrees of anteversion.  The hip was reduced.  Stability was excellent, in 90 of flexion, in almost 80 of internal rotation and full extension.  The hip was  externally rotated about 40 degrees and could not be dislocated, and the knee flexed all the way.  At this point, we noticed a small crack in the calcar region at the stalk of cone and as a safety measure, we went ahead and put a couple Dall-Miles cables, 1 inner trochanter, 1 just below the lesser trochanter to reinforce the stability of the bone. All trial components were then removed.  We then hammered into place a 20-D small ZTT 1 cone followed by a 20 x 15 x 165 x 42 S-ROM stem, slightly anteverted in relation of the calcar, a total of about 15 degrees, followed by the +0 36 mm Zirconia head.  The hip was reduced. Stability checked again found to be excellent.  Wound irrigated out with normal saline solution.  The capsular flap and short external rotators were repaired back to the intertrochanteric crest through drill holes. We did use a #2 Ethibond suture.  The IT band was then closed with a running #1 Vicryl suture.  The subcutaneous tissue with 0 and 2-0 undyed Vicryl suture and the skin with running interlocking 3-0 nylon suture. Dressing of Xeroform and Mepilex was then applied.  The patient unclamped, rolled supine, awakened, extubated and taken to the  recovery room without difficulty.     Feliberto Gottron. Turner Daniels, M.D.     Ovid Curd  D:  03/01/2011  T:  03/02/2011  Job:  [id number]  Electronically Signed by Gean Birchwood M.D. on 03/06/2011 10:36:21 PM

## 2011-03-31 ENCOUNTER — Observation Stay (HOSPITAL_COMMUNITY)
Admission: EM | Admit: 2011-03-31 | Discharge: 2011-04-01 | Disposition: A | Discharge status: Home / Self Care | Payer: BC Managed Care – PPO | Source: Ambulatory Visit | Attending: Emergency Medicine | Admitting: Emergency Medicine

## 2011-03-31 ENCOUNTER — Encounter: Payer: Self-pay | Admitting: Emergency Medicine

## 2011-03-31 ENCOUNTER — Ambulatory Visit
Admission: RE | Admit: 2011-03-31 | Discharge: 2011-03-31 | Disposition: A | Discharge status: Home / Self Care | Payer: BC Managed Care – PPO | Source: Ambulatory Visit | Attending: Orthopaedic Surgery | Admitting: Orthopaedic Surgery

## 2011-03-31 ENCOUNTER — Other Ambulatory Visit: Payer: Self-pay | Admitting: Orthopaedic Surgery

## 2011-03-31 DIAGNOSIS — M329 Systemic lupus erythematosus, unspecified: Secondary | ICD-10-CM | POA: Insufficient documentation

## 2011-03-31 DIAGNOSIS — Z96649 Presence of unspecified artificial hip joint: Secondary | ICD-10-CM | POA: Insufficient documentation

## 2011-03-31 DIAGNOSIS — I82409 Acute embolism and thrombosis of unspecified deep veins of unspecified lower extremity: Secondary | ICD-10-CM

## 2011-03-31 DIAGNOSIS — M1990 Unspecified osteoarthritis, unspecified site: Secondary | ICD-10-CM | POA: Insufficient documentation

## 2011-03-31 DIAGNOSIS — M109 Gout, unspecified: Secondary | ICD-10-CM | POA: Insufficient documentation

## 2011-03-31 DIAGNOSIS — R609 Edema, unspecified: Secondary | ICD-10-CM

## 2011-03-31 DIAGNOSIS — I824Y9 Acute embolism and thrombosis of unspecified deep veins of unspecified proximal lower extremity: Principal | ICD-10-CM | POA: Insufficient documentation

## 2011-03-31 HISTORY — DX: Unspecified osteoarthritis, unspecified site: M19.90

## 2011-03-31 HISTORY — DX: Systemic lupus erythematosus, unspecified: M32.9

## 2011-03-31 HISTORY — DX: Reserved for concepts with insufficient information to code with codable children: IMO0002

## 2011-03-31 HISTORY — DX: Essential (primary) hypertension: I10

## 2011-03-31 LAB — BASIC METABOLIC PANEL
CO2: 28 mEq/L (ref 19–32)
Calcium: 8.9 mg/dL (ref 8.4–10.5)
Creatinine, Ser: 1.09 mg/dL (ref 0.50–1.10)
Glucose, Bld: 143 mg/dL — ABNORMAL HIGH (ref 70–99)

## 2011-03-31 LAB — CBC
Hemoglobin: 9.8 g/dL — ABNORMAL LOW (ref 12.0–15.0)
MCH: 28.6 pg (ref 26.0–34.0)
MCV: 89.5 fL (ref 78.0–100.0)
Platelets: 170 10*3/uL (ref 150–400)
RBC: 3.43 MIL/uL — ABNORMAL LOW (ref 3.87–5.11)

## 2011-03-31 MED ORDER — ACETAMINOPHEN 325 MG PO TABS
650.0000 mg | ORAL_TABLET | ORAL | Status: DC | PRN
  Administered 2011-03-31: 650 mg via ORAL
  Filled 2011-03-31: qty 2

## 2011-03-31 MED ORDER — ZOLPIDEM TARTRATE 5 MG PO TABS: 5.0000 mg | ORAL_TABLET | Freq: Every evening | ORAL | Status: DC | PRN

## 2011-03-31 MED ORDER — WARFARIN SODIUM 5 MG PO TABS
5.0000 mg | ORAL_TABLET | Freq: Once | ORAL | Status: AC
  Administered 2011-03-31: 5 mg via ORAL
  Filled 2011-03-31: qty 1

## 2011-03-31 MED ORDER — ENOXAPARIN SODIUM 80 MG/0.8ML ~~LOC~~ SOLN
75.0000 mg | Freq: Once | SUBCUTANEOUS | Status: AC
  Administered 2011-03-31: 75 mg via SUBCUTANEOUS
  Filled 2011-03-31: qty 0.8

## 2011-03-31 MED ORDER — OLMESARTAN MEDOXOMIL 40 MG PO TABS
40.0000 mg | ORAL_TABLET | Freq: Every day | ORAL | Status: DC
  Administered 2011-04-01: 40 mg via ORAL
  Filled 2011-03-31: qty 1

## 2011-03-31 MED ORDER — MORPHINE SULFATE 4 MG/ML IJ SOLN: 4.0000 mg | INTRAMUSCULAR | Status: DC | PRN

## 2011-03-31 MED ORDER — AMLODIPINE BESYLATE 5 MG PO TABS
5.0000 mg | ORAL_TABLET | Freq: Every day | ORAL | Status: DC
  Administered 2011-04-01: 5 mg via ORAL
  Filled 2011-03-31: qty 1

## 2011-03-31 MED ORDER — ONDANSETRON HCL 4 MG/2ML IJ SOLN: 4.0000 mg | Freq: Four times a day (QID) | INTRAMUSCULAR | Status: DC | PRN

## 2011-03-31 MED ORDER — WARFARIN SODIUM 5 MG PO TABS
5.0000 mg | ORAL_TABLET | Freq: Every day | ORAL | Status: DC
  Filled 2011-03-31: qty 1

## 2011-03-31 MED ORDER — ALLOPURINOL 100 MG PO TABS
100.0000 mg | ORAL_TABLET | Freq: Every day | ORAL | Status: DC
  Administered 2011-04-01: 100 mg via ORAL
  Filled 2011-03-31: qty 1

## 2011-03-31 MED ORDER — HYDROCHLOROTHIAZIDE 12.5 MG PO CAPS
12.5000 mg | ORAL_CAPSULE | Freq: Every day | ORAL | Status: DC
  Administered 2011-04-01: 12.5 mg via ORAL
  Filled 2011-03-31: qty 1

## 2011-03-31 MED ORDER — ENOXAPARIN SODIUM 80 MG/0.8ML ~~LOC~~ SOLN
75.0000 mg | Freq: Two times a day (BID) | SUBCUTANEOUS | Status: DC
  Administered 2011-04-01: 80 mg via SUBCUTANEOUS
  Filled 2011-03-31 (×2): qty 0.8

## 2011-03-31 NOTE — ED Provider Notes (Signed)
10:18 PM VS reviewed, Lori Riley is hemodynamically stable, NAD, RRR, no current pain or other complaints. No SOB, palpitations, hemoptysis or CP.   Lori Riley placed on DVT protocol for anticoagulation per MAHONEY-TESORIERO, KATIE to be re-evaluated in the morning.   note that a RLE DVT was dx by Dr. Carlean Jews, pts orthopedic, who reccomended she come to the ED.  Lori Riley is to follow up w her PCP Dr. Petra Kuba 3-5 days after dc for re-check of INR   Jaci Carrel, [place] 03/31/11 2227  Medical screening examination/treatment/procedure(s) were performed by non-physician practitioner and as supervising physician I was immediately available for consultation/collaboration.   Sunnie Nielsen, MD 04/01/11 [phone]

## 2011-03-31 NOTE — ED Notes (Signed)
PT provided blankets for comfort. Family at bedside.

## 2011-03-31 NOTE — ED Notes (Signed)
Pt sent here by imaging center; pt c/o left knee pain x 3 days and had recent hip replacement; pt sent for doppler and has positive for DVT

## 2011-03-31 NOTE — ED Notes (Signed)
PT provided with blankets 

## 2011-03-31 NOTE — ED Notes (Signed)
PT placed on monitor

## 2011-03-31 NOTE — ED Notes (Signed)
Patient is resting comfortably. 

## 2011-03-31 NOTE — ED Provider Notes (Signed)
History     CSN: [id number] Arrival date & time: 03/31/2011  5:13 PM   First MD Initiated Contact with Patient 03/31/11 2059      Chief Complaint  Patient presents with  . Knee Pain    (Consider location/radiation/quality/duration/timing/severity/associated sxs/prior treatment) Patient is a 60 y.o. female presenting with knee pain. The history is provided by the patient.  Knee Pain This is a new problem. The current episode started yesterday. The problem occurs constantly. The problem has been unchanged. Pertinent negatives include no abdominal pain, chest pain, chills, coughing, fever, headaches, joint swelling, nausea, rash or vomiting. The symptoms are aggravated by walking. She has tried nothing for the symptoms.    Past Medical History  Diagnosis Date  . Lupus   . Osteoarthritis   . Gout   . Hypertension     Past Surgical History  Procedure Date  . Total hip arthroplasty     History reviewed. No pertinent family history.  History  Substance Use Topics  . Smoking status: Never Smoker   . Smokeless tobacco: Not on file  . Alcohol Use: No    OB History    Grav Para Term Preterm Abortions TAB SAB Ect Mult Living                  Review of Systems  Constitutional: Negative for fever and chills.  Respiratory: Negative for cough and shortness of breath.   Cardiovascular: Positive for leg swelling. Negative for chest pain and palpitations.  Gastrointestinal: Negative for nausea, vomiting and abdominal pain.  Musculoskeletal: Negative for back pain, joint swelling and gait problem.  Skin: Negative for color change and rash.  Neurological: Negative for light-headedness and headaches.  All other systems reviewed and are negative.    Allergies  Sulfa antibiotics  Home Medications   Current Outpatient Rx  Name Route Sig Dispense Refill  . ALLOPURINOL 100 MG PO TABS Oral Take 100 mg by mouth daily.      Marland Kitchen AMLODIPINE-OLMESARTAN 5-20 MG PO TABS Oral Take 1  tablet by mouth daily.      Marland Kitchen NAPROXEN SODIUM 220 MG PO TABS Oral Take 220-440 mg by mouth every 6 (six) hours as needed. For pain     . OLMESARTAN MEDOXOMIL-HCTZ 40-12.5 MG PO TABS Oral Take 1 tablet by mouth daily.      Marland Kitchen PREDNISONE 10 MG PO TABS Oral Take 10 mg by mouth daily.      Marland Kitchen VITAMIN D (ERGOCALCIFEROL) 50000 UNITS PO CAPS Oral Take 50,000 Units by mouth every 14 (fourteen) days.        BP 128/66  Pulse 65  Temp(Src) 99.1 F (37.3 C) (Oral)  Resp 20  Ht 5' 7.5" (1.715 m)  Wt 160 lb (72.576 kg)  BMI 24.69 kg/m2  SpO2 96%  Physical Exam  Nursing note and vitals reviewed. Constitutional: She is oriented to person, place, and time. She appears well-developed and well-nourished.  HENT:  Head: Normocephalic and atraumatic.  Eyes: Pupils are equal, round, and reactive to light.  Cardiovascular: Normal rate, regular rhythm, normal heart sounds and intact distal pulses.   Pulmonary/Chest: Effort normal and breath sounds normal. No respiratory distress.  Abdominal: Soft. She exhibits no distension. There is no tenderness.  Musculoskeletal: She exhibits tenderness (L lower extremity, mild posterior fullness).  Neurological: She is alert and oriented to person, place, and time.  Skin: Skin is warm and dry.  Psychiatric: She has a normal mood and affect.    ED  Course  Procedures (including critical care time)  Labs Reviewed  CBC - Abnormal; Notable for the following:    RBC 3.43 (*)    Hemoglobin 9.8 (*)    HCT 30.7 (*)    All other components within normal limits  BASIC METABOLIC PANEL - Abnormal; Notable for the following:    Glucose, Bld 143 (*)    GFR calc non Af Amer 55 (*)    GFR calc Af Amer 64 (*)    All other components within normal limits  PROTIME-INR  PROTIME-INR   US Venous Img Lower Unilateral Left  03/31/2011  *RADIOLOGY REPORT*  Clinical Data: Left calf pain and swelling.  Recent left hip replacement surgery.  LEFT LOWER EXTREMITY VENOUS DOPPLER  ULTRASOUND  Technique: Gray-scale sonography with compression, as well as color and duplex ultrasound, were performed to evaluate the deep venous system from the level of the common femoral vein through the popliteal and proximal calf veins.  Comparison: None  Findings: There is partially occlusive thrombus in the distal superficial femoral vein and more extensive occlusive thrombus in the popliteal vein.  There are thrombosed posterior tibial veins noted.  More proximally, there is normal compressibility of  the common femoral and profunda femoris  veins.  No filling defects to suggest DVT on grayscale or color Doppler imaging.  Doppler waveforms show normal direction of venous flow, normal respiratory phasicity and response to augmentation.  IMPRESSION: 1.  Positive for femoral and popliteal DVT. I telephoned the critical test results to Dr. Jerl Santos at the time of interpretation.  Original Report Authenticated By: Thora Lance III, M.D.    1. DVT (deep venous thrombosis)       MDM  60 yo F presents with DVT; had L THR 10/31 and has been recovering well, but yesterday had onset of L lower extremity pain and swelling, worse with walking. Was sent to outpatient radiology, where US showed LLE DVT, so she was told to come to the ED. Denies CP, SOB, cough, hemoptysis, any other complaints to suggest PE. Will check basic labs, and transfer to CDU for DVT protocol. Will need to be started on bridging lovenox until coumadin therapeutic, and will need PCP f/u in 3-5 days for INR check. Pt expresses understanding of plan.        Theotis Burrow, MD 03/31/11 [phone]

## 2011-03-31 NOTE — ED Notes (Signed)
Family at bedside. 

## 2011-04-01 MED ORDER — WARFARIN SODIUM 5 MG PO TABS: 5.0000 mg | ORAL_TABLET | Freq: Every day | ORAL | Status: DC

## 2011-04-01 MED ORDER — ENOXAPARIN SODIUM 100 MG/ML ~~LOC~~ SOLN: 75.0000 mg | Freq: Two times a day (BID) | SUBCUTANEOUS | Status: DC

## 2011-04-01 MED ORDER — ENOXAPARIN (LOVENOX) PATIENT EDUCATION KIT
PACK | Freq: Once | Status: AC
  Administered 2011-04-01: 12:00:00

## 2011-04-01 NOTE — ED Notes (Addendum)
20G SL noted to be to left [facility] - rather than right hand

## 2011-04-01 NOTE — ED Notes (Signed)
Pt sent here by orthopedist for poss dvt.  L hip replacement this October.  Pt moving with cane, however, she has been experiencing increased pain to L gastrocnemius.  LLE swollen, no pitting edema.  Pain increases with palpation.  Pedal pulse present.

## 2011-04-01 NOTE — ED Notes (Signed)
Spoke with pharmacist x 2 re: pt's meds.  Pt and daughter aware of delay.

## 2011-04-01 NOTE — ED Notes (Signed)
Pt aware RN will perform Lovenox teaching when pt's daughter returns.  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

## 2011-04-01 NOTE — ED Provider Notes (Signed)
Pt seen and examined by me in CDU. Pt with left leg pain behind the knee, swelling. Pt was placed in CDU for DVT protocol due to a newly diagnosed DVT. Pt's VS are normal curerntly, She is in NAD. AAOx3. Lungs clear to auscultation bilat. Regular HR and rhythm. Left leg erythemous, swelling noted distal to the knee. Pt denies SOB, chest pain, no other sings of a PE. Plan to d/c home with coumadin, lovenox, close follow up with pcp  Lottie Mussel, PA 04/01/11 6213  Medical screening examination/treatment/procedure(s) were conducted as a shared visit with non-physician practitioner(s) and myself.  I personally evaluated the patient during the encounter  Sunnie Nielsen, MD 04/01/11 [phone]

## 2011-04-01 NOTE — ED Notes (Signed)
Meal tray ordered from service response center 

## 2011-04-01 NOTE — ED Provider Notes (Signed)
I saw and evaluated the patient, reviewed the resident's note and I agree with the findings and plan.  Patient arrived to the ED with documented DVT.  She is status post hip replacement.  The distal extremity has good pulses but mild swelling.  Will put in CDU under DVT protocol.  Geoffery Lyons, MD 04/01/11 [phone]

## 2013-02-05 ENCOUNTER — Emergency Department (INDEPENDENT_AMBULATORY_CARE_PROVIDER_SITE_OTHER)
Admission: EM | Admit: 2013-02-05 | Discharge: 2013-02-05 | Disposition: A | Discharge status: Home / Self Care | Payer: 59 | Source: Home / Self Care | Attending: Family Medicine | Admitting: Family Medicine

## 2013-02-05 ENCOUNTER — Emergency Department (INDEPENDENT_AMBULATORY_CARE_PROVIDER_SITE_OTHER): Payer: 59

## 2013-02-05 ENCOUNTER — Encounter (HOSPITAL_COMMUNITY): Payer: Self-pay | Admitting: Emergency Medicine

## 2013-02-05 DIAGNOSIS — M25569 Pain in unspecified knee: Secondary | ICD-10-CM

## 2013-02-05 DIAGNOSIS — M25561 Pain in right knee: Secondary | ICD-10-CM

## 2013-02-05 DIAGNOSIS — M1990 Unspecified osteoarthritis, unspecified site: Secondary | ICD-10-CM

## 2013-02-05 MED ORDER — METHYLPREDNISOLONE ACETATE 40 MG/ML IJ SUSP
INTRAMUSCULAR | Status: AC
  Filled 2013-02-05: qty 5

## 2013-02-05 MED ORDER — METHYLPREDNISOLONE ACETATE 40 MG/ML IJ SUSP
40.0000 mg | Freq: Once | INTRAMUSCULAR | Status: AC
  Administered 2013-02-05: 40 mg via INTRA_ARTICULAR

## 2013-02-05 MED ORDER — TRAMADOL HCL 50 MG PO TABS: 50.0000 mg | ORAL_TABLET | Freq: Four times a day (QID) | ORAL | Status: DC | PRN

## 2013-02-05 NOTE — ED Notes (Signed)
Reportedly injured knee 10-3 while playing putt-putt w her grandkids

## 2013-02-05 NOTE — ED Notes (Addendum)
Patient transported to X-ray; delay of d/c due to outstanding orders (9:30 .. Knee)

## 2013-02-05 NOTE — ED Provider Notes (Signed)
Lori Riley is a 62 y.o. female who presents to Urgent Care today for right knee pain. Patient twisted her knee on October 3 plan put the golf with her grandchildren. She tripped over a rise in the ground. She did not fall. She notes knee pain and swelling since then. She has tried witch hazel rubs as well as oral ibuprofen. Her past medical history is pertinent for lupus, gout, and bilateral hip arthritis status post total hip replacement bilaterally. She denies any radiating pain fevers or chills. The pain is mild to moderate.   Past Medical History  Diagnosis Date  . Lupus   . Osteoarthritis   . Gout   . Hypertension    History  Substance Use Topics  . Smoking status: Never Smoker   . Smokeless tobacco: Not on file  . Alcohol Use: No   ROS as above Medications reviewed. No current facility-administered medications for this encounter.   Current Outpatient Prescriptions  Medication Sig Dispense Refill  . allopurinol (ZYLOPRIM) 100 MG tablet Take 100 mg by mouth daily.        . naproxen sodium (ANAPROX) 220 MG tablet Take 220-440 mg by mouth every 6 (six) hours as needed. For pain       . olmesartan-hydrochlorothiazide (BENICAR HCT) 40-12.5 MG per tablet Take 1 tablet by mouth daily.        . predniSONE (DELTASONE) 10 MG tablet Take 10 mg by mouth daily.        . Vitamin D, Ergocalciferol, (DRISDOL) 50000 UNITS CAPS Take 50,000 Units by mouth every 14 (fourteen) days.        Marland Kitchen amLODipine-olmesartan (AZOR) 5-20 MG per tablet Take 1 tablet by mouth daily.        Marland Kitchen enoxaparin (LOVENOX) 100 MG/ML SOLN Inject 0.75 mLs (75 mg total) into the skin every 12 (twelve) hours.  12 Syringe  0  . traMADol (ULTRAM) 50 MG tablet Take 1 tablet (50 mg total) by mouth every 6 (six) hours as needed for pain.  15 tablet  0  . warfarin (COUMADIN) 5 MG tablet Take 1 tablet (5 mg total) by mouth daily.  15 tablet  0    Exam:  BP 158/89  Pulse 103  Temp(Src) 98.5 F (36.9 C) (Oral)  Resp 24  SpO2  98% Gen: Well NAD RIGHT KNEE: Moderate effusion present. Nontender. Range of motion 0-100. Stable ligamentous exam. Left knee: No significant effusion nontender full range of motion stable ligamentous exam Hips bilaterally: Nontender range of motion Capillary refill sensation are intact distally no significant effusion bilaterally lower extremities  No results found for this or any previous visit (from the past 24 hour(s)). Dg Knee Complete 4 Views Right  02/05/2013   CLINICAL DATA:  Twisted knee Friday  EXAM: RIGHT KNEE - COMPLETE 4+ VIEW  COMPARISON:  None.  FINDINGS: Four views of the right knee submitted. There is narrowing of lateral joint compartment. Spurring of lateral femoral condyle and lateral tibial plateau. Mild spurring of medial femoral condyle and medial tibial plateau. Narrowing of patellofemoral joint space. Moderate joint effusion. Atherosclerotic calcifications of femoral and popliteal artery. No acute fracture or subluxation.  IMPRESSION: No acute fracture or subluxation. Osteoarthritic changes as described above.   Electronically Signed   By: Natasha Mead M.D.   On: 02/05/2013 10:17   Right knee aspiration and injection. Consent obtained.  Superior lateral patellar space cleaned with alcohol cold spray applied and 5 mL of lidocaine was injected along the planned  aspiration tract using a 27-gauge 1-1/2 inch needle.  The area was then cleaned with Betadine and an 18-gauge 1-1/2 inch needle was used to access the superior lateral patellar space. 30 mL of bloody-appearing liquid were aspirated. The syringe was exchanged and 4 mL of 0.5% Marcaine and 40 mg of Depo-Medrol injected into the joint.  Patient tolerated the procedure well.   Assessment and Plan: 62 y.o. female with right knee DJD with pain. Patient was treated with aspiration and injection. Additionally she was given tramadol for pain relief. She'll followup at [facility] if not improved. Discussed  warning signs or symptoms. Please see discharge instructions. Patient expresses understanding.      Rodolph Bong, MD 02/05/13 1046

## 2013-02-25 ENCOUNTER — Encounter: Payer: Self-pay | Admitting: Certified Nurse Midwife

## 2013-03-04 ENCOUNTER — Ambulatory Visit (INDEPENDENT_AMBULATORY_CARE_PROVIDER_SITE_OTHER): Payer: 59 | Admitting: Certified Nurse Midwife

## 2013-03-04 ENCOUNTER — Encounter: Payer: Self-pay | Admitting: Certified Nurse Midwife

## 2013-03-04 VITALS — BP 130/60 | HR 72 | Resp 16 | Ht 67.25 in | Wt 190.0 lb

## 2013-03-04 DIAGNOSIS — Z01419 Encounter for gynecological examination (general) (routine) without abnormal findings: Secondary | ICD-10-CM

## 2013-03-04 DIAGNOSIS — Z87898 Personal history of other specified conditions: Secondary | ICD-10-CM

## 2013-03-04 DIAGNOSIS — Z8742 Personal history of other diseases of the female genital tract: Secondary | ICD-10-CM

## 2013-03-04 DIAGNOSIS — E559 Vitamin D deficiency, unspecified: Secondary | ICD-10-CM

## 2013-03-04 NOTE — Progress Notes (Signed)
62 y.o. [id number] Divorced African American Fe here for annual exam.Menopausal no HRT Sees PCP for medication management for hypertension, labs, aex. Patient denies vaginal bleeding or vaginal dryness. Patient losing employment in nursing facility. Patient has "jean rub" from wearing new jeans. Denies itching or vaginal discharge. Not sexually active.  No health issues today.  Patient's last menstrual period was 05/01/1997.          Sexually active: no  The current method of family planning is tubal ligation.    Exercising: no  exercise Smoker:  no  Health Maintenance: Pap:  03-01-12 neg +HPV, genotype 16/18 neg MMG:  01-12-13 Colonoscopy:  none BMD:  2010 TDaP: 03-01-12 Labs: none Self breast exam: done occ   reports that she has never smoked. She does not have any smokeless tobacco history on file. She reports that she does not drink alcohol or use illicit drugs.  Past Medical History  Diagnosis Date  . Lupus   . Osteoarthritis   . Gout     rt foot  . Hypertension   . Osteoporosis   . HPV (human papilloma virus) infection     negative genotype  . Anemia     Past Surgical History  Procedure Laterality Date  . Total hip arthroplasty  2005    rt & left  . Cystocele repair    . Tubal ligation  1983    Current Outpatient Prescriptions  Medication Sig Dispense Refill  . allopurinol (ZYLOPRIM) 100 MG tablet Take 100 mg by mouth daily.        Marland Kitchen olmesartan-hydrochlorothiazide (BENICAR HCT) 40-25 MG per tablet Take 1 tablet by mouth daily.      . predniSONE (DELTASONE) 10 MG tablet Take 10 mg by mouth daily.        . Vitamin D, Ergocalciferol, (DRISDOL) 50000 UNITS CAPS Take 50,000 Units by mouth every 7 (seven) days.       Marland Kitchen warfarin (COUMADIN) 5 MG tablet Take 1 tablet (5 mg total) by mouth daily.  15 tablet  0   No current facility-administered medications for this visit.    Family History  Problem Relation Age of Onset  . Hypertension Mother   . Breast cancer Sister   .  Hypertension Sister   . Hypertension Sister   . Stroke Sister   . Hypertension Sister     ROS:  Pertinent items are noted in HPI.  Otherwise, a comprehensive ROS was negative.  Exam:   BP 130/60  Pulse 72  Resp 16  Ht 5' 7.25" (1.708 m)  Wt 190 lb (86.183 kg)  BMI 29.54 kg/m2  LMP 05/01/1997 Height: 5' 7.25" (170.8 cm)  Ht Readings from Last 3 Encounters:  03/04/13 5' 7.25" (1.708 m)  03/31/11 5' 7.5" (1.715 m)  02/22/11 5\' 7"  (1.702 m)    General appearance: alert, cooperative and appears stated age Head: Normocephalic, without obvious abnormality, atraumatic Neck: no adenopathy, supple, symmetrical, trachea midline and thyroid normal to inspection and palpation Lungs: clear to auscultation bilaterally Breasts: normal appearance, no masses or tenderness, No nipple retraction or dimpling, No nipple discharge or bleeding, No axillary or supraclavicular adenopathy Heart: regular rate and rhythm Abdomen: soft, non-tender; no masses,  no organomegaly Extremities: extremities normal, atraumatic, no cyanosis or edema Skin: Skin color, texture, turgor normal. No rashes or lesions Lymph nodes: Cervical, supraclavicular, and axillary nodes normal. No abnormal inguinal nodes palpated Neurologic: Grossly normal   Pelvic: External genitalia:  no lesions  Urethra:  normal appearing urethra with no masses, tenderness or lesions              Bartholin's and Skene's: normal                 Vagina: normal appearing vagina with normal color and discharge, no lesions              Cervix: normal, non tender              Pap taken: yes Bimanual Exam:  Uterus:  normal size, contour, position, consistency, mobility, non-tender and anteverted              Adnexa: normal adnexa and no mass, fullness, tenderness               Rectovaginal: Confirms               Anus:  normal sphincter tone, no lesions  A:  Well Woman with normal exam  Menopausal no HRT  History of arthritis with  PCP management  Lupus with PCP management  Social stress with employment status  History of negative pap with positive HPVHR, 16,18 negative  History of vitamin D deficiency  P:   Reviewed health and wellness pertinent to exam  Aware of need to evaluate if vaginal bleeding  Continue follow up as indicated  Lab Vit. D  Seek family and friend support  Pap smear as per guidelines   Mammogram yearly pap smear taken today with HPVHR  counseled on breast self exam, mammography screening, menopause, adequate intake of calcium and vitamin D, diet and exercise. Discussed risk and benefits of colonoscopy will call with name of MD. To schedule with. return annually or prn  An After Visit Summary was printed and given to the patient.

## 2013-03-04 NOTE — Patient Instructions (Signed)

## 2013-03-05 ENCOUNTER — Telehealth: Payer: Self-pay

## 2013-03-05 LAB — VITAMIN D 25 HYDROXY (VIT D DEFICIENCY, FRACTURES): Vit D, 25-Hydroxy: 24 ng/mL — ABNORMAL LOW (ref 30–89)

## 2013-03-05 MED ORDER — VITAMIN D (ERGOCALCIFEROL) 1.25 MG (50000 UNIT) PO CAPS: 50000.0000 [IU] | ORAL_CAPSULE | ORAL | Status: AC

## 2013-03-05 NOTE — Telephone Encounter (Signed)
Message copied by Eliezer Bottom on Wed Mar 05, 2013 11:31 AM ------      Message from: Verner Chol      Created: Wed Mar 05, 2013  8:28 AM       Notify patient that Vitamin D low 24      Rx renewed but take one every 2 weeks ------

## 2013-03-05 NOTE — Progress Notes (Signed)
Note reviewed, agree with plan.  Remi Lopata, MD  

## 2013-03-05 NOTE — Telephone Encounter (Signed)
lmtcb

## 2013-03-06 ENCOUNTER — Other Ambulatory Visit: Payer: Self-pay | Admitting: Certified Nurse Midwife

## 2013-03-06 DIAGNOSIS — IMO0002 Reserved for concepts with insufficient information to code with codable children: Secondary | ICD-10-CM

## 2013-03-06 LAB — IPS PAP TEST WITH HPV

## 2013-03-10 ENCOUNTER — Other Ambulatory Visit: Payer: Self-pay | Admitting: Certified Nurse Midwife

## 2013-03-10 DIAGNOSIS — Z1211 Encounter for screening for malignant neoplasm of colon: Secondary | ICD-10-CM

## 2013-03-11 ENCOUNTER — Telehealth: Payer: Self-pay | Admitting: Emergency Medicine

## 2013-03-11 DIAGNOSIS — IMO0002 Reserved for concepts with insufficient information to code with codable children: Secondary | ICD-10-CM

## 2013-03-11 NOTE — Telephone Encounter (Signed)
Patient informed of lab results with abnormal pap and message from Verner Chol CNM  Advised will need colposcopy for evaluation for further testing. Advised that colposcopy is very important test for follow up to evaluate cells.   Educated regarding procedure:  Looking at the cervix with a microscope to help see abnormal cells.  Sometimes a biopsy is taken, feels like a pinch then a cramp. Biopsies are then sent for testing.  When we have results, provider will decided time intervals for follow up. Procedure takes about 20-30 minutes.   Educated regarding instructions prior to procedure: Do not have intercourse the day before procedure.  Motrin instructions given. Motrin=Advil=Ibuprofen Can take 800 mg (Can purchase over the counter, you will need four 200 mg pills) for cramps.  Take with food. Make sure to eat a meal before appointment and drink plenty of fluids. Patient does not have menses. Patient verbalized understanding. She is also to use moisturizer as well.

## 2013-03-11 NOTE — Telephone Encounter (Signed)
Lori Riley has to notify pt of pap results so she said she will notify her of vit d also

## 2013-03-11 NOTE — Telephone Encounter (Signed)
Message copied by Joeseph Amor on Tue Mar 11, 2013 11:55 AM ------      Message from: Verner Chol      Created: Thu Mar 06, 2013 12:02 PM       Notify patient that pap smear is negative as last year but HPVHR positive again so she needs colposcopy evaluation      Patient needs to use Olive oil or OTC counter moisturizer for 2 weeks prior to exam      Order in ------

## 2013-03-11 NOTE — Telephone Encounter (Signed)
Left message on home phone.   Spoke with patient on Mobile, she asked that I call her back in 30 minutes as she was driving. Call home number.

## 2013-03-12 ENCOUNTER — Telehealth: Payer: Self-pay | Admitting: Certified Nurse Midwife

## 2013-03-12 NOTE — Telephone Encounter (Signed)
Left vague message about needing to speak with her about her ins benefits for upcoming appt.

## 2013-03-18 NOTE — Telephone Encounter (Signed)
LMTCB to discuss Insurance benefits for upcoming Colpo.

## 2013-03-19 ENCOUNTER — Emergency Department (INDEPENDENT_AMBULATORY_CARE_PROVIDER_SITE_OTHER)
Admission: EM | Admit: 2013-03-19 | Discharge: 2013-03-19 | Disposition: A | Discharge status: Home / Self Care | Payer: 59 | Source: Home / Self Care | Attending: Family Medicine | Admitting: Family Medicine

## 2013-03-19 ENCOUNTER — Encounter (HOSPITAL_COMMUNITY): Payer: Self-pay | Admitting: Emergency Medicine

## 2013-03-19 DIAGNOSIS — R1110 Vomiting, unspecified: Secondary | ICD-10-CM

## 2013-03-19 DIAGNOSIS — H8110 Benign paroxysmal vertigo, unspecified ear: Secondary | ICD-10-CM

## 2013-03-19 MED ORDER — MECLIZINE HCL 25 MG PO TABS: 25.0000 mg | ORAL_TABLET | Freq: Three times a day (TID) | ORAL | Status: DC | PRN

## 2013-03-19 MED ORDER — ONDANSETRON 4 MG PO TBDP
ORAL_TABLET | ORAL | Status: AC
  Filled 2013-03-19: qty 2

## 2013-03-19 MED ORDER — ONDANSETRON HCL 8 MG PO TABS: 8.0000 mg | ORAL_TABLET | Freq: Three times a day (TID) | ORAL | Status: DC | PRN

## 2013-03-19 MED ORDER — ONDANSETRON 4 MG PO TBDP
8.0000 mg | ORAL_TABLET | Freq: Once | ORAL | Status: AC
  Administered 2013-03-19: 8 mg via ORAL

## 2013-03-19 NOTE — ED Notes (Signed)
Dizzy,nauseated

## 2013-03-19 NOTE — Telephone Encounter (Signed)
Patient scheduled for Colpo on 11/25 with Debbi.

## 2013-03-19 NOTE — ED Provider Notes (Signed)
Lori Riley is a 62 y.o. female who presents to Urgent Care today for vertigo and vomiting. Yesterday evening patient developed vertigo and has had intermittent vomiting. She notes intermittent vertigo worse with head motion and better with rest. When she is very vertiginous she develops nausea and vomits. She has not tried any medications for this. She has taken her blood pressure medications today. She denies any chest pain or palpitations weakness or numbness. She denies any tinnitus or diarrhea. She is well otherwise.    Past Medical History  Diagnosis Date  . Lupus   . Osteoarthritis   . Gout     rt foot  . Hypertension   . Osteoporosis   . HPV (human papilloma virus) infection     negative genotype  . Anemia    History  Substance Use Topics  . Smoking status: Never Smoker   . Smokeless tobacco: Not on file  . Alcohol Use: No   ROS as above Medications reviewed. No current facility-administered medications for this encounter.   Current Outpatient Prescriptions  Medication Sig Dispense Refill  . allopurinol (ZYLOPRIM) 100 MG tablet Take 100 mg by mouth daily.        . meclizine (ANTIVERT) 25 MG tablet Take 1 tablet (25 mg total) by mouth 3 (three) times daily as needed for dizziness or nausea.  30 tablet  0  . olmesartan-hydrochlorothiazide (BENICAR HCT) 40-25 MG per tablet Take 1 tablet by mouth daily.      . ondansetron (ZOFRAN) 8 MG tablet Take 1 tablet (8 mg total) by mouth every 8 (eight) hours as needed for nausea or vomiting.  20 tablet  0  . predniSONE (DELTASONE) 10 MG tablet Take 10 mg by mouth daily.        . Vitamin D, Ergocalciferol, (DRISDOL) 50000 UNITS CAPS capsule Take 1 capsule (50,000 Units total) by mouth every 14 (fourteen) days.  30 capsule  12  . warfarin (COUMADIN) 5 MG tablet Take 1 tablet (5 mg total) by mouth daily.  15 tablet  0    Exam:  BP 144/87  Pulse 90  Temp(Src) 98.1 F (36.7 C) (Oral)  Resp 16  SpO2 100%  LMP 05/01/1997 Gen:  Well NAD HEENT: EOMI,  MMM Lungs: CTABL Nl WOB Heart: RRR no MRG Abd: NABS, Soft. NT, ND Exts: Non edematous BL  LE, warm and well perfused.  Neuro: Patient has vertigo with mild nystagmus with left head motion.   No results found for this or any previous visit (from the past 24 hour(s)). No results found.  Assessment and Plan: 62 y.o. female with BPPV with vomiting.  Plan to treat with meclizine and Zofran. Followup with primary care provider.  Discussed warning signs or symptoms. Please see discharge instructions. Patient expresses understanding.      Rodolph Bong, MD 03/19/13 [phone]

## 2013-03-21 NOTE — Telephone Encounter (Signed)
Routing to Lori Riley. 

## 2013-03-25 ENCOUNTER — Ambulatory Visit: Payer: 59 | Admitting: Certified Nurse Midwife

## 2013-03-25 NOTE — Addendum Note (Signed)
Addended by: Joeseph Amor on: 03/25/2013 02:36 PM   Modules accepted: Orders

## 2013-03-25 NOTE — Telephone Encounter (Signed)
Patient's pap negative with positive HPV.  Patient came to appointment without payment.  Appointment rescheduled and patient agreeable.  Routing to provider for final review. Patient agreeable to disposition. Will close encounter

## 2013-03-25 NOTE — Telephone Encounter (Signed)
Patient came in for scheduled appointment. She is unable to pay total amount. Checked with Billie Ruddy and okay to reschedule. Patient would like December, appointment on 12/12 scheduled and patient aware, will bring total amount. States she may have job change and insurance may change as well, advised to let us know as soon as possible if insurance changes. Patient verbalized understanding of preprocedure instructions as well.

## 2013-04-09 NOTE — Telephone Encounter (Signed)
scheduled for Fri 04-11-13.

## 2013-04-11 ENCOUNTER — Ambulatory Visit (INDEPENDENT_AMBULATORY_CARE_PROVIDER_SITE_OTHER): Payer: BC Managed Care – PPO | Admitting: Certified Nurse Midwife

## 2013-04-11 ENCOUNTER — Encounter: Payer: Self-pay | Admitting: Certified Nurse Midwife

## 2013-04-11 VITALS — BP 124/64 | HR 76 | Resp 16 | Ht 67.25 in | Wt 180.0 lb

## 2013-04-11 DIAGNOSIS — R87810 Cervical high risk human papillomavirus (HPV) DNA test positive: Secondary | ICD-10-CM

## 2013-04-11 DIAGNOSIS — IMO0002 Reserved for concepts with insufficient information to code with codable children: Secondary | ICD-10-CM

## 2013-04-11 DIAGNOSIS — R6889 Other general symptoms and signs: Secondary | ICD-10-CM

## 2013-04-11 NOTE — Progress Notes (Signed)
Patient ID: Lori Riley, female   DOB: [birth year], 62 y.o.   MRN: [id number]  Chief Complaint  Patient presents with  . Colposcopy    HPI Lori Riley is a 62 y.o. female. African [place]  [id number] here for colposcopy. Patient denies vaginal bleeding or pelvic pain. HPI  Indications: Pap smear on 11/4 2014 showed: +HPVHR. Previous pap 03/2012 showed same. Previous colposcopy: none. Prior cervical treatment: not applicable.  Past Medical History  Diagnosis Date  . Lupus   . Osteoarthritis   . Gout     rt foot  . Hypertension   . Osteoporosis   . HPV (human papilloma virus) infection     negative genotype  . Anemia     Past Surgical History  Procedure Laterality Date  . Total hip arthroplasty  2005    rt & left  . Cystocele repair    . Tubal ligation  1983    Family History  Problem Relation Age of Onset  . Hypertension Mother   . Breast cancer Sister   . Hypertension Sister   . Hypertension Sister   . Stroke Sister   . Hypertension Sister     Social History History  Substance Use Topics  . Smoking status: Never Smoker   . Smokeless tobacco: Not on file  . Alcohol Use: No    Allergies  Allergen Reactions  . Latex Itching  . Sulfa Antibiotics Itching    Current Outpatient Prescriptions  Medication Sig Dispense Refill  . allopurinol (ZYLOPRIM) 100 MG tablet Take 100 mg by mouth daily.        Marland Kitchen olmesartan-hydrochlorothiazide (BENICAR HCT) 40-25 MG per tablet Take 1 tablet by mouth daily.      . predniSONE (DELTASONE) 10 MG tablet Take 10 mg by mouth daily.        . Vitamin D, Ergocalciferol, (DRISDOL) 50000 UNITS CAPS capsule Take 1 capsule (50,000 Units total) by mouth every 14 (fourteen) days.  30 capsule  12   No current facility-administered medications for this visit.    Review of Systems Review of Systems  Constitutional: Negative.   Genitourinary: Negative for vaginal bleeding, vaginal discharge and vaginal pain.    Blood pressure  124/64, pulse 76, resp. rate 16, height 5' 7.25" (1.708 m), weight 180 lb (81.647 kg), last menstrual period 05/01/1997.  Physical Exam Physical Exam  Constitutional: She is oriented to person, place, and time. She appears well-developed and well-nourished.  Genitourinary: Vagina normal.    Neurological: She is alert and oriented to person, place, and time.  Skin: Skin is warm and dry.  Psychiatric: She has a normal mood and affect. Judgment normal.    Data Reviewed Reviewed pap smear results  Assessment   History of +HPVHR x 2 with normal pap smear Menopausal, no HRT  Procedure Details  The risks and benefits of the procedure and Written informed consent obtained.  Speculum placed in vagina and excellent visualization of cervix achieved, cervix swabbed x 3 with saline and  with acetic acid solution. No acetowhite area or lesion noted with 3.75,7.5,15# and green filter.Lugols applied and no area on non staining noted. ECC obtained. No bleeding noted on removal of speculum. Patient tolerated procedure well. Instructions given  Specimens: one  Complications: none.     Plan    Specimens labelled and sent to Pathology. Patient will be notified of results when reviewed.    Pathology reviewed: ECC results show squamous mucosa with focal changes consistent with HPV effect and  LSIL, no high grade dysplasia noted Patient to be notified of results and need for repeat pap smear in one year. 04/15/13   LEONARD,DEBORAH 04/11/2013, 2:37 PM

## 2013-04-11 NOTE — Patient Instructions (Addendum)

## 2013-04-11 NOTE — Progress Notes (Signed)
03/04/13 neg pap with +HPV HR.Previous hx of +HPV 11/13. Pt took 800mg  ibuprofen at 12:30

## 2013-04-15 LAB — IPS CERVICAL/ECC/EMB/VULVAR/VAGINAL BIOPSY

## 2013-04-16 ENCOUNTER — Telehealth: Payer: Self-pay

## 2013-04-16 NOTE — Progress Notes (Signed)
Reviewed personally.  M. Suzanne Trish Mancinelli, MD.  

## 2013-04-16 NOTE — Telephone Encounter (Signed)
Called pt. And notified of colposcopy results.  Advised needs repeat pap in one year.  Placed on 08 pap recall.  (documented in error on Colposcopy results stating pt. Had been notified by D. Darcel Bayley).

## 2014-03-02 ENCOUNTER — Encounter: Payer: Self-pay | Admitting: Certified Nurse Midwife

## 2014-03-05 ENCOUNTER — Ambulatory Visit: Payer: 59 | Admitting: Certified Nurse Midwife

## 2014-06-03 ENCOUNTER — Telehealth: Payer: Self-pay | Admitting: *Deleted

## 2014-06-03 NOTE — Telephone Encounter (Signed)
08 recall for previous LSIL and HPV on previous colpo  Colpo 04/11/13 showed LSIL and HPV on ECC Pap 03/04/13, negative with pos HR HPV Pap 03/01/12, negative with pos HR HPV with neg 16/18  Pt is not scheduled for AEX with French Ana, CNM.  Please call patient to schedule.  Thanks.

## 2014-06-05 NOTE — Telephone Encounter (Signed)
Left Message To Call Back  

## 2014-06-05 NOTE — Telephone Encounter (Signed)
Called and s/w patient she was waiting for new insurance card which she just got in the mail, scheduled her for AEX 07/03/14 with Ms. Debbie.

## 2014-06-05 NOTE — Telephone Encounter (Deleted)
Left Message To Call Back  

## 2014-06-17 NOTE — Telephone Encounter (Signed)
Recall date changed to 07/29/14.   Routing to provider for final review.  Closing encounter.

## 2014-07-03 ENCOUNTER — Ambulatory Visit: Payer: Self-pay | Admitting: Certified Nurse Midwife

## 2014-11-19 ENCOUNTER — Encounter: Payer: Self-pay | Admitting: *Deleted

## 2014-11-19 ENCOUNTER — Telehealth: Payer: Self-pay | Admitting: *Deleted

## 2014-11-19 NOTE — Telephone Encounter (Signed)
Needs letter and then will remove from recall.

## 2014-11-19 NOTE — Telephone Encounter (Signed)
Pt was previously called regarding 08 recall (phone note dated 06/03/14) and scheduled AEX for 07/03/14.  Pt cancelled appointment and has not rescheduled. Letter created.  Please advise recall.  08 recall for previous LSIL and HPV on previous colpo  Colpo 04/11/13 showed LSIL and HPV on ECC Pap 03/04/13, negative with pos HR HPV Pap 03/01/12, negative with pos HR HPV with neg 16/18

## 2014-11-20 NOTE — Telephone Encounter (Signed)
Letter signed and mailed.  Pt removed from current recall.  Closing encounter.

## 2015-03-19 ENCOUNTER — Ambulatory Visit (HOSPITAL_COMMUNITY)
Admission: RE | Admit: 2015-03-19 | Discharge: 2015-03-19 | Disposition: A | Discharge status: Home / Self Care | Payer: 59 | Source: Ambulatory Visit | Attending: Pulmonary Disease | Admitting: Pulmonary Disease

## 2015-03-19 ENCOUNTER — Other Ambulatory Visit (HOSPITAL_COMMUNITY): Payer: Self-pay | Admitting: Pulmonary Disease

## 2015-03-19 DIAGNOSIS — M25552 Pain in left hip: Secondary | ICD-10-CM | POA: Diagnosis not present

## 2015-03-19 DIAGNOSIS — M25562 Pain in left knee: Secondary | ICD-10-CM | POA: Diagnosis not present

## 2015-03-19 DIAGNOSIS — I708 Atherosclerosis of other arteries: Secondary | ICD-10-CM | POA: Insufficient documentation

## 2015-03-19 DIAGNOSIS — Z96643 Presence of artificial hip joint, bilateral: Secondary | ICD-10-CM | POA: Diagnosis not present

## 2015-03-19 DIAGNOSIS — Z96649 Presence of unspecified artificial hip joint: Secondary | ICD-10-CM

## 2015-03-19 DIAGNOSIS — M25462 Effusion, left knee: Secondary | ICD-10-CM | POA: Diagnosis not present

## 2015-04-15 IMAGING — CR DG KNEE COMPLETE 4+V*R*
4 series · 4 of 4 positions shown · non-contrast
Comparison: None.

CLINICAL DATA: Twisted knee [REDACTED]

EXAM:
RIGHT KNEE - COMPLETE 4+ VIEW

[view not recorded (1 of 4)]
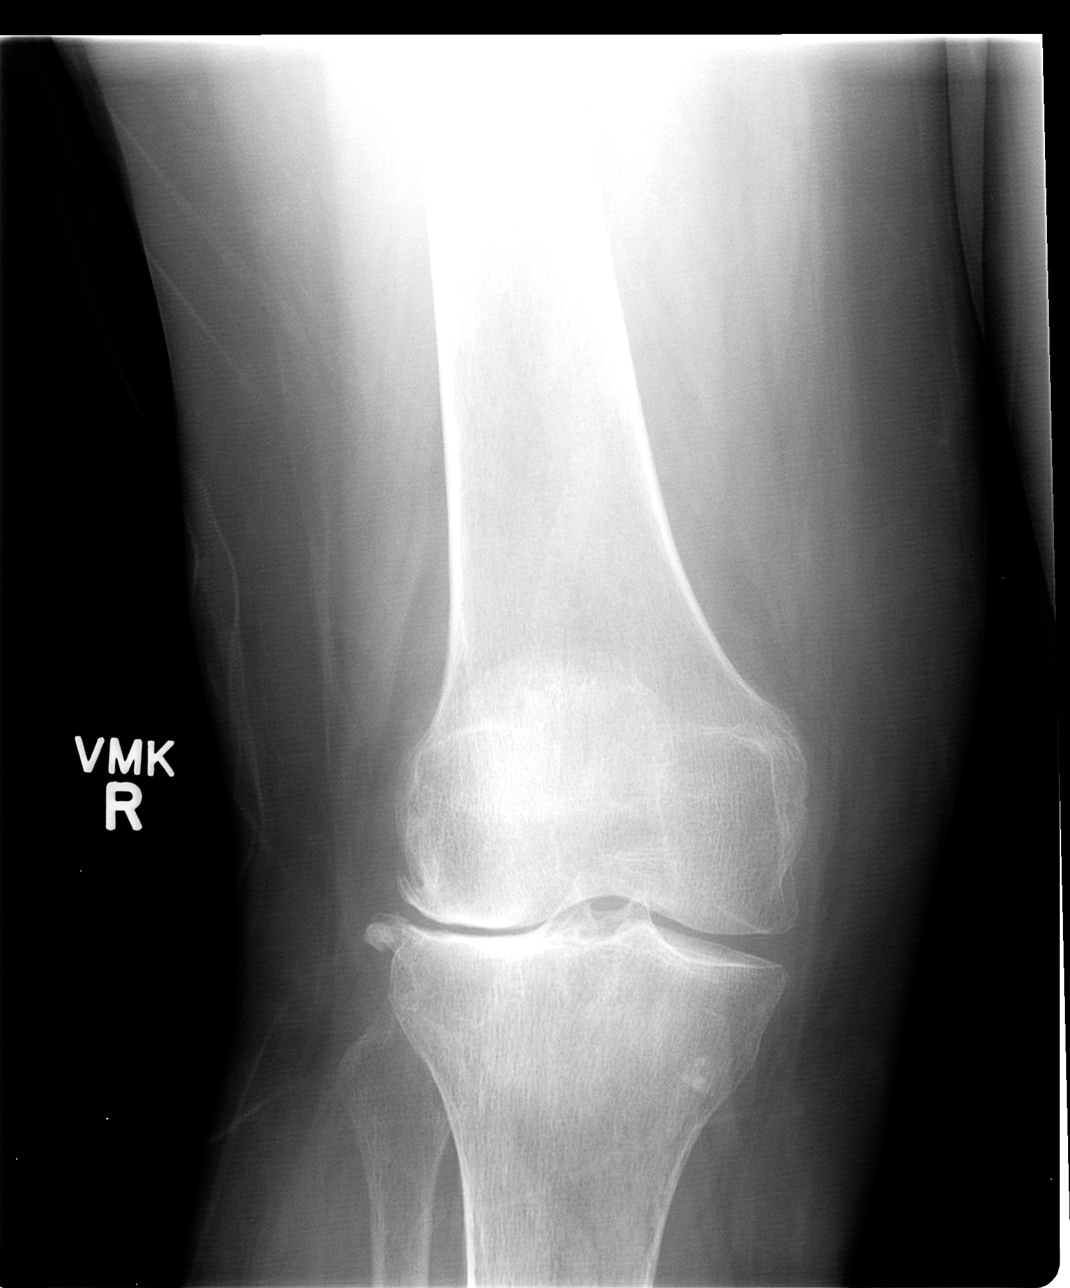

[view not recorded (2 of 4)]
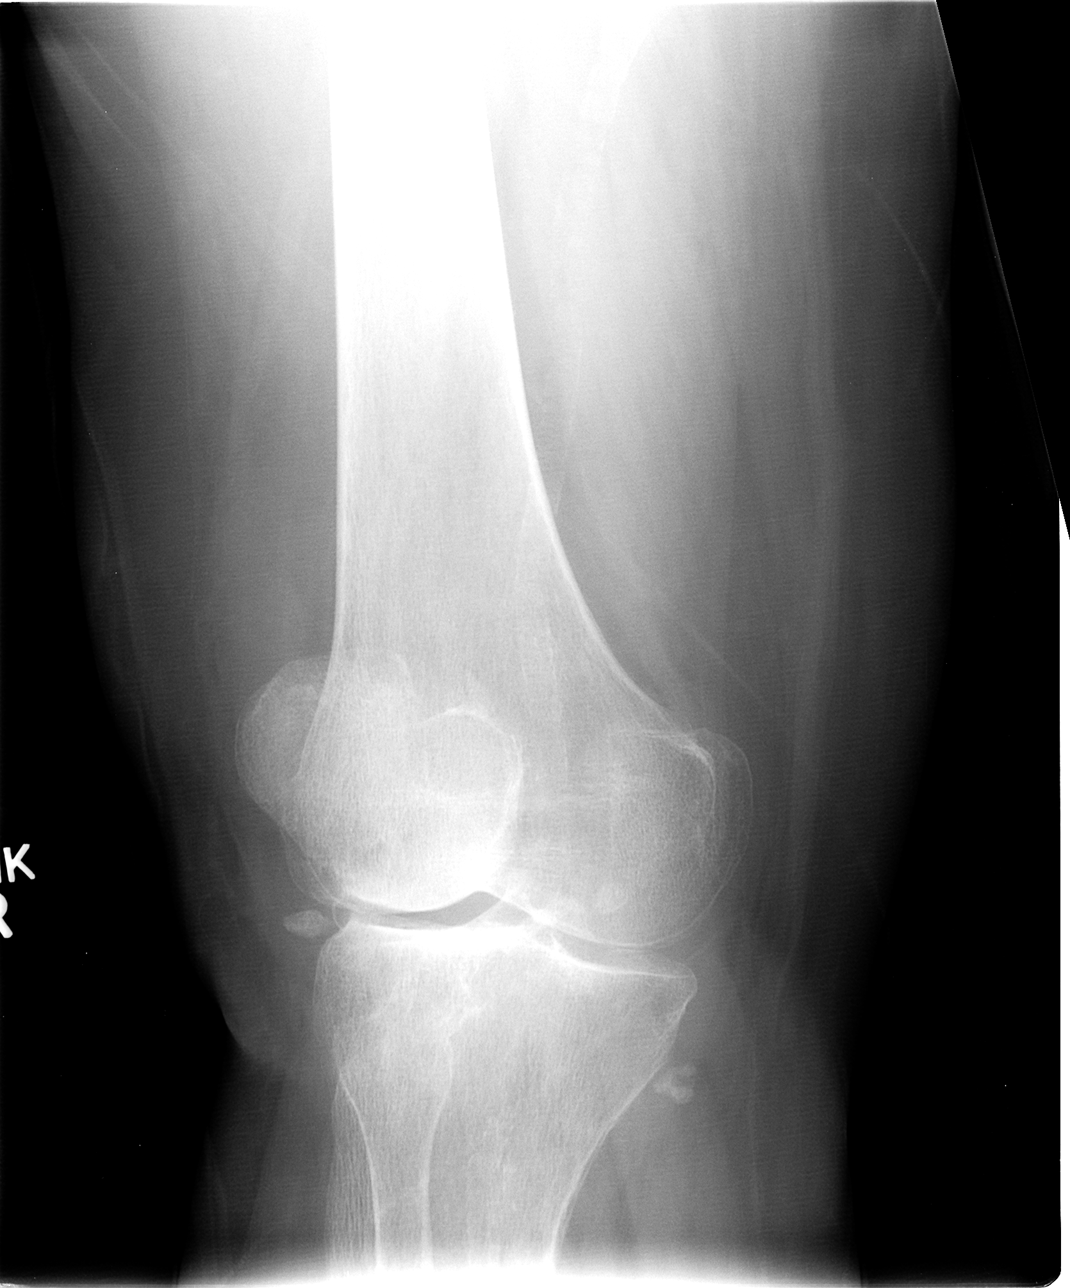

[view not recorded (3 of 4)]
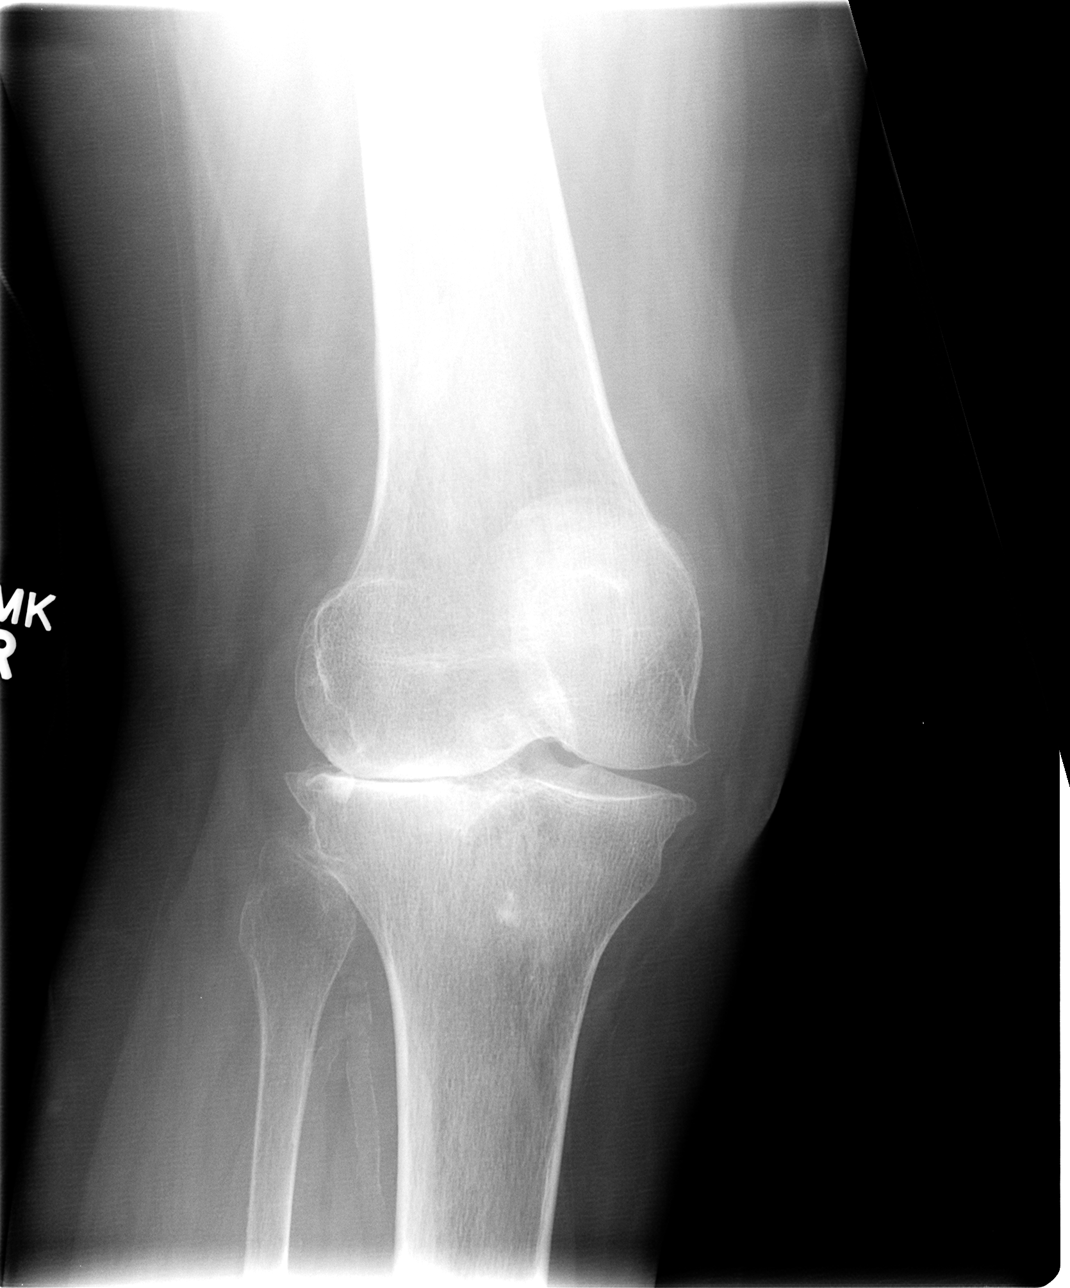

[view not recorded (4 of 4)]
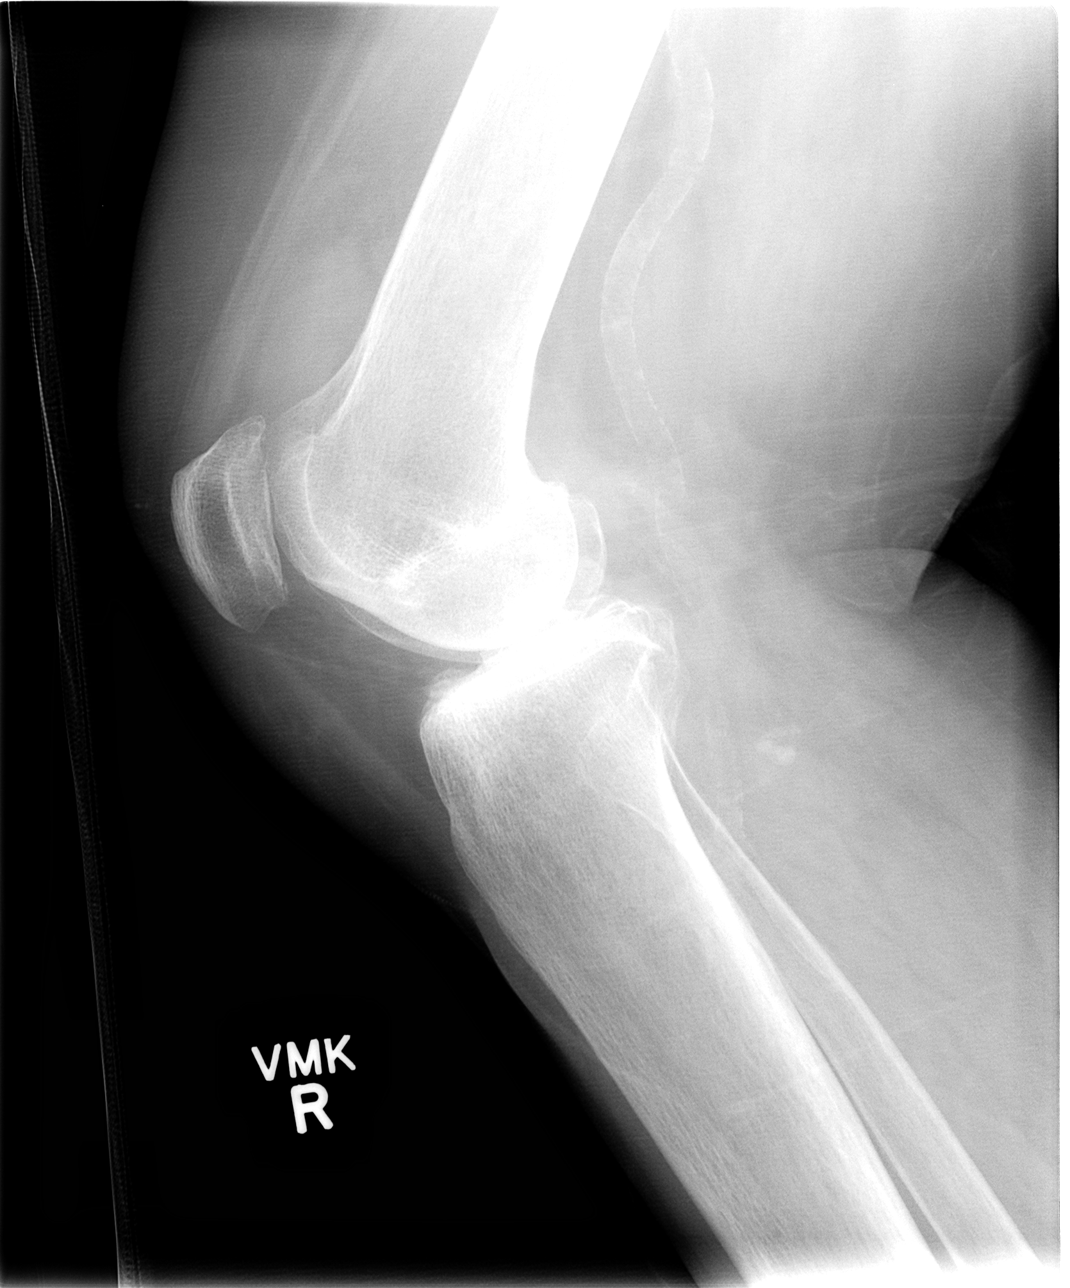

[4 of 4 positions shown; findings below may reference images not displayed]

FINDINGS: Four views of the right knee submitted. There is narrowing of
lateral joint compartment. Spurring of lateral femoral condyle and
lateral tibial plateau. Mild spurring of medial femoral condyle and
medial tibial plateau. Narrowing of patellofemoral joint space.
Moderate joint effusion. Atherosclerotic calcifications of femoral
and popliteal artery. No acute fracture or subluxation.
IMPRESSION: No acute fracture or subluxation. Osteoarthritic changes as
described above.

## 2016-11-09 LAB — GLUCOSE, POCT (MANUAL RESULT ENTRY): POC Glucose: 92 mg/dl (ref 70–99)

## 2017-03-06 ENCOUNTER — Encounter (HOSPITAL_BASED_OUTPATIENT_CLINIC_OR_DEPARTMENT_OTHER): Payer: Worker's Compensation | Attending: Surgery

## 2017-03-06 DIAGNOSIS — T8131XA Disruption of external operation (surgical) wound, not elsewhere classified, initial encounter: Secondary | ICD-10-CM | POA: Diagnosis not present

## 2017-03-06 DIAGNOSIS — Y838 Other surgical procedures as the cause of abnormal reaction of the patient, or of later complication, without mention of misadventure at the time of the procedure: Secondary | ICD-10-CM | POA: Insufficient documentation

## 2017-03-06 DIAGNOSIS — M328 Other forms of systemic lupus erythematosus: Secondary | ICD-10-CM | POA: Insufficient documentation

## 2017-03-06 DIAGNOSIS — Z79899 Other long term (current) drug therapy: Secondary | ICD-10-CM | POA: Diagnosis not present

## 2017-03-06 DIAGNOSIS — N182 Chronic kidney disease, stage 2 (mild): Secondary | ICD-10-CM | POA: Insufficient documentation

## 2017-03-06 DIAGNOSIS — I890 Lymphedema, not elsewhere classified: Secondary | ICD-10-CM | POA: Diagnosis not present

## 2017-03-06 DIAGNOSIS — Z96643 Presence of artificial hip joint, bilateral: Secondary | ICD-10-CM | POA: Diagnosis not present

## 2017-03-06 DIAGNOSIS — M109 Gout, unspecified: Secondary | ICD-10-CM | POA: Insufficient documentation

## 2017-03-06 DIAGNOSIS — M1990 Unspecified osteoarthritis, unspecified site: Secondary | ICD-10-CM | POA: Insufficient documentation

## 2017-03-06 DIAGNOSIS — Z7952 Long term (current) use of systemic steroids: Secondary | ICD-10-CM | POA: Diagnosis not present

## 2017-03-06 DIAGNOSIS — Z86718 Personal history of other venous thrombosis and embolism: Secondary | ICD-10-CM | POA: Diagnosis not present

## 2017-03-06 DIAGNOSIS — I129 Hypertensive chronic kidney disease with stage 1 through stage 4 chronic kidney disease, or unspecified chronic kidney disease: Secondary | ICD-10-CM | POA: Diagnosis not present

## 2017-03-11 LAB — GLUCOSE, POCT (MANUAL RESULT ENTRY): POC GLUCOSE: 96 mg/dL (ref 70–99)

## 2017-03-14 DIAGNOSIS — T8131XA Disruption of external operation (surgical) wound, not elsewhere classified, initial encounter: Secondary | ICD-10-CM | POA: Diagnosis not present

## 2017-03-20 DIAGNOSIS — T8131XA Disruption of external operation (surgical) wound, not elsewhere classified, initial encounter: Secondary | ICD-10-CM | POA: Diagnosis not present

## 2017-03-28 ENCOUNTER — Other Ambulatory Visit (HOSPITAL_COMMUNITY)
Admission: RE | Admit: 2017-03-28 | Discharge: 2017-03-28 | Disposition: A | Discharge status: Home / Self Care | Payer: PRIVATE HEALTH INSURANCE | Source: Other Acute Inpatient Hospital | Attending: Surgery | Admitting: Surgery

## 2017-03-28 DIAGNOSIS — S81812A Laceration without foreign body, left lower leg, initial encounter: Secondary | ICD-10-CM | POA: Insufficient documentation

## 2017-03-28 DIAGNOSIS — T8131XA Disruption of external operation (surgical) wound, not elsewhere classified, initial encounter: Secondary | ICD-10-CM | POA: Diagnosis not present

## 2017-03-28 DIAGNOSIS — L97222 Non-pressure chronic ulcer of left calf with fat layer exposed: Secondary | ICD-10-CM | POA: Insufficient documentation

## 2017-03-31 LAB — AEROBIC CULTURE W GRAM STAIN (SUPERFICIAL SPECIMEN)

## 2017-03-31 LAB — AEROBIC CULTURE  (SUPERFICIAL SPECIMEN)

## 2017-04-04 ENCOUNTER — Encounter (HOSPITAL_BASED_OUTPATIENT_CLINIC_OR_DEPARTMENT_OTHER): Payer: Worker's Compensation | Attending: Surgery

## 2017-04-04 DIAGNOSIS — I890 Lymphedema, not elsewhere classified: Secondary | ICD-10-CM | POA: Diagnosis not present

## 2017-04-04 DIAGNOSIS — Z7952 Long term (current) use of systemic steroids: Secondary | ICD-10-CM | POA: Insufficient documentation

## 2017-04-04 DIAGNOSIS — L97822 Non-pressure chronic ulcer of other part of left lower leg with fat layer exposed: Secondary | ICD-10-CM | POA: Insufficient documentation

## 2017-04-04 DIAGNOSIS — Z86718 Personal history of other venous thrombosis and embolism: Secondary | ICD-10-CM | POA: Diagnosis not present

## 2017-04-04 DIAGNOSIS — I10 Essential (primary) hypertension: Secondary | ICD-10-CM | POA: Insufficient documentation

## 2017-04-04 DIAGNOSIS — L930 Discoid lupus erythematosus: Secondary | ICD-10-CM | POA: Diagnosis not present

## 2017-04-11 DIAGNOSIS — L97822 Non-pressure chronic ulcer of other part of left lower leg with fat layer exposed: Secondary | ICD-10-CM | POA: Diagnosis not present

## 2017-04-18 DIAGNOSIS — L97822 Non-pressure chronic ulcer of other part of left lower leg with fat layer exposed: Secondary | ICD-10-CM | POA: Diagnosis not present

## 2017-04-25 DIAGNOSIS — L97822 Non-pressure chronic ulcer of other part of left lower leg with fat layer exposed: Secondary | ICD-10-CM | POA: Diagnosis not present

## 2017-05-02 ENCOUNTER — Encounter (HOSPITAL_BASED_OUTPATIENT_CLINIC_OR_DEPARTMENT_OTHER): Payer: Self-pay | Attending: Physician Assistant

## 2017-05-02 DIAGNOSIS — Z7952 Long term (current) use of systemic steroids: Secondary | ICD-10-CM | POA: Insufficient documentation

## 2017-05-02 DIAGNOSIS — L97822 Non-pressure chronic ulcer of other part of left lower leg with fat layer exposed: Secondary | ICD-10-CM | POA: Insufficient documentation

## 2017-05-02 DIAGNOSIS — Z86718 Personal history of other venous thrombosis and embolism: Secondary | ICD-10-CM | POA: Insufficient documentation

## 2017-05-02 DIAGNOSIS — I890 Lymphedema, not elsewhere classified: Secondary | ICD-10-CM | POA: Insufficient documentation

## 2017-05-02 DIAGNOSIS — I10 Essential (primary) hypertension: Secondary | ICD-10-CM | POA: Insufficient documentation

## 2017-05-02 DIAGNOSIS — M328 Other forms of systemic lupus erythematosus: Secondary | ICD-10-CM | POA: Insufficient documentation

## 2017-06-06 ENCOUNTER — Encounter (HOSPITAL_BASED_OUTPATIENT_CLINIC_OR_DEPARTMENT_OTHER): Payer: Worker's Compensation | Attending: Physician Assistant

## 2017-06-06 DIAGNOSIS — Z96643 Presence of artificial hip joint, bilateral: Secondary | ICD-10-CM | POA: Diagnosis not present

## 2017-06-06 DIAGNOSIS — L97822 Non-pressure chronic ulcer of other part of left lower leg with fat layer exposed: Secondary | ICD-10-CM | POA: Insufficient documentation

## 2017-06-06 DIAGNOSIS — N182 Chronic kidney disease, stage 2 (mild): Secondary | ICD-10-CM | POA: Insufficient documentation

## 2017-06-06 DIAGNOSIS — M328 Other forms of systemic lupus erythematosus: Secondary | ICD-10-CM | POA: Diagnosis not present

## 2017-06-13 DIAGNOSIS — L97822 Non-pressure chronic ulcer of other part of left lower leg with fat layer exposed: Secondary | ICD-10-CM | POA: Diagnosis not present

## 2017-06-20 DIAGNOSIS — L97822 Non-pressure chronic ulcer of other part of left lower leg with fat layer exposed: Secondary | ICD-10-CM | POA: Diagnosis not present

## 2017-06-27 DIAGNOSIS — L97822 Non-pressure chronic ulcer of other part of left lower leg with fat layer exposed: Secondary | ICD-10-CM | POA: Diagnosis not present

## 2017-06-29 ENCOUNTER — Encounter (HOSPITAL_BASED_OUTPATIENT_CLINIC_OR_DEPARTMENT_OTHER): Payer: Self-pay | Attending: Physician Assistant

## 2017-06-29 DIAGNOSIS — I890 Lymphedema, not elsewhere classified: Secondary | ICD-10-CM | POA: Insufficient documentation

## 2017-06-29 DIAGNOSIS — I129 Hypertensive chronic kidney disease with stage 1 through stage 4 chronic kidney disease, or unspecified chronic kidney disease: Secondary | ICD-10-CM | POA: Insufficient documentation

## 2017-06-29 DIAGNOSIS — N182 Chronic kidney disease, stage 2 (mild): Secondary | ICD-10-CM | POA: Insufficient documentation

## 2017-06-29 DIAGNOSIS — M328 Other forms of systemic lupus erythematosus: Secondary | ICD-10-CM | POA: Insufficient documentation

## 2017-06-29 DIAGNOSIS — Z7952 Long term (current) use of systemic steroids: Secondary | ICD-10-CM | POA: Insufficient documentation

## 2017-06-29 DIAGNOSIS — L97222 Non-pressure chronic ulcer of left calf with fat layer exposed: Secondary | ICD-10-CM | POA: Insufficient documentation

## 2017-07-04 ENCOUNTER — Encounter (HOSPITAL_BASED_OUTPATIENT_CLINIC_OR_DEPARTMENT_OTHER): Payer: Self-pay

## 2017-07-05 ENCOUNTER — Other Ambulatory Visit: Payer: Self-pay | Admitting: Physician Assistant

## 2017-07-05 ENCOUNTER — Ambulatory Visit (HOSPITAL_COMMUNITY)
Admission: RE | Admit: 2017-07-05 | Discharge: 2017-07-05 | Disposition: A | Discharge status: Home / Self Care | Payer: Worker's Compensation | Source: Ambulatory Visit | Attending: Cardiovascular Disease | Admitting: Cardiovascular Disease

## 2017-07-05 DIAGNOSIS — L97922 Non-pressure chronic ulcer of unspecified part of left lower leg with fat layer exposed: Secondary | ICD-10-CM | POA: Diagnosis not present

## 2017-07-05 DIAGNOSIS — L97929 Non-pressure chronic ulcer of unspecified part of left lower leg with unspecified severity: Secondary | ICD-10-CM | POA: Diagnosis present

## 2017-07-05 DIAGNOSIS — I10 Essential (primary) hypertension: Secondary | ICD-10-CM | POA: Diagnosis not present

## 2017-08-01 ENCOUNTER — Encounter (HOSPITAL_BASED_OUTPATIENT_CLINIC_OR_DEPARTMENT_OTHER): Payer: PRIVATE HEALTH INSURANCE | Attending: Physician Assistant

## 2017-08-01 DIAGNOSIS — Z86718 Personal history of other venous thrombosis and embolism: Secondary | ICD-10-CM | POA: Insufficient documentation

## 2017-08-01 DIAGNOSIS — I129 Hypertensive chronic kidney disease with stage 1 through stage 4 chronic kidney disease, or unspecified chronic kidney disease: Secondary | ICD-10-CM | POA: Diagnosis not present

## 2017-08-01 DIAGNOSIS — N182 Chronic kidney disease, stage 2 (mild): Secondary | ICD-10-CM | POA: Diagnosis not present

## 2017-08-01 DIAGNOSIS — L97822 Non-pressure chronic ulcer of other part of left lower leg with fat layer exposed: Secondary | ICD-10-CM | POA: Diagnosis present

## 2017-08-08 DIAGNOSIS — L97822 Non-pressure chronic ulcer of other part of left lower leg with fat layer exposed: Secondary | ICD-10-CM | POA: Diagnosis not present

## 2017-08-15 DIAGNOSIS — L97822 Non-pressure chronic ulcer of other part of left lower leg with fat layer exposed: Secondary | ICD-10-CM | POA: Diagnosis not present

## 2017-08-22 DIAGNOSIS — L97822 Non-pressure chronic ulcer of other part of left lower leg with fat layer exposed: Secondary | ICD-10-CM | POA: Diagnosis not present

## 2017-08-29 ENCOUNTER — Encounter (HOSPITAL_BASED_OUTPATIENT_CLINIC_OR_DEPARTMENT_OTHER): Payer: Self-pay | Attending: Physician Assistant

## 2017-08-29 DIAGNOSIS — M328 Other forms of systemic lupus erythematosus: Secondary | ICD-10-CM | POA: Insufficient documentation

## 2017-08-29 DIAGNOSIS — Z86718 Personal history of other venous thrombosis and embolism: Secondary | ICD-10-CM | POA: Insufficient documentation

## 2017-08-29 DIAGNOSIS — Z96643 Presence of artificial hip joint, bilateral: Secondary | ICD-10-CM | POA: Insufficient documentation

## 2017-08-29 DIAGNOSIS — I890 Lymphedema, not elsewhere classified: Secondary | ICD-10-CM | POA: Insufficient documentation

## 2017-08-29 DIAGNOSIS — I129 Hypertensive chronic kidney disease with stage 1 through stage 4 chronic kidney disease, or unspecified chronic kidney disease: Secondary | ICD-10-CM | POA: Insufficient documentation

## 2017-08-29 DIAGNOSIS — N182 Chronic kidney disease, stage 2 (mild): Secondary | ICD-10-CM | POA: Insufficient documentation

## 2017-08-29 DIAGNOSIS — L97822 Non-pressure chronic ulcer of other part of left lower leg with fat layer exposed: Secondary | ICD-10-CM | POA: Insufficient documentation

## 2017-08-29 DIAGNOSIS — Z7952 Long term (current) use of systemic steroids: Secondary | ICD-10-CM | POA: Insufficient documentation

## 2017-08-29 DIAGNOSIS — M810 Age-related osteoporosis without current pathological fracture: Secondary | ICD-10-CM | POA: Insufficient documentation

## 2017-08-29 DIAGNOSIS — L84 Corns and callosities: Secondary | ICD-10-CM | POA: Insufficient documentation

## 2018-02-02 LAB — GLUCOSE, POCT (MANUAL RESULT ENTRY): POC GLUCOSE: 100 mg/dL — AB (ref 70–99)

## 2018-02-03 ENCOUNTER — Other Ambulatory Visit: Payer: Self-pay

## 2018-02-03 ENCOUNTER — Ambulatory Visit (HOSPITAL_COMMUNITY)
Admission: EM | Admit: 2018-02-03 | Discharge: 2018-02-03 | Disposition: A | Discharge status: Home / Self Care | Payer: Medicare Other | Attending: Internal Medicine | Admitting: Internal Medicine

## 2018-02-03 ENCOUNTER — Encounter (HOSPITAL_COMMUNITY): Payer: Self-pay | Admitting: Emergency Medicine

## 2018-02-03 DIAGNOSIS — M7022 Olecranon bursitis, left elbow: Secondary | ICD-10-CM

## 2018-02-03 MED ORDER — NAPROXEN 500 MG PO TABS: 500.0000 mg | ORAL_TABLET | Freq: Two times a day (BID) | ORAL | 0 refills | Status: AC

## 2018-02-03 NOTE — ED Triage Notes (Signed)
The patient presented to the [facility] with a complaint of left elbow swelling and pain. The patient denied any known injury.

## 2018-02-03 NOTE — Discharge Instructions (Signed)
Please take Naprosyn twice daily with food on your stomach over the next 1 to 2 weeks Please wear Ace wrap to also help with compression and decreasing swelling  Please follow-up if symptoms not improving with consistently doing the above, developing increased redness, pain, swelling or difficulty moving arm

## 2018-02-03 NOTE — ED Provider Notes (Signed)
[place]    CSN: [id number] Arrival date & time: 02/03/18  1009     History   Chief Complaint Chief Complaint  Patient presents with  . Joint Swelling    HPI IVYANNA Riley is a 67 y.o. female history of osteoarthritis, osteoporosis, gout, lupus presenting today for evaluation of left elbow swelling.  Patient states that she has had swelling in this elbow for approximately the last month.  She has had some mild pain when she puts pressure on this elbow, but otherwise minimal discomfort.  Denies any specific injury, does remember a time when she went camping and believes that she put pressure on her elbow more so than.  Denies difficulty moving her elbow.  She has not taken any medicines for this.  Denies fevers.  HPI  Past Medical History:  Diagnosis Date  . Anemia   . Gout    rt foot  . HPV (human papilloma virus) infection    negative genotype  . Hypertension   . Lupus ([place])   . Osteoarthritis   . Osteoporosis     Patient Active Problem List   Diagnosis Date Noted  . Lupus ([place])   . Osteoarthritis   . Gout     Past Surgical History:  Procedure Laterality Date  . CYSTOCELE REPAIR    . TOTAL HIP ARTHROPLASTY  2005   rt & left  . TUBAL LIGATION  1983    OB History    Gravida  3   Para  3   Term  3   Preterm      AB      Living  2     SAB      TAB      Ectopic      Multiple      Live Births  2            Home Medications    Prior to Admission medications   Medication Sig Start Date End Date Taking? Authorizing Provider  allopurinol (ZYLOPRIM) 100 MG tablet Take 100 mg by mouth daily.     Yes [provider]  amLODipine (NORVASC) 2.5 MG tablet Take 2.5 mg by mouth daily.   Yes [provider]  chlorthalidone (HYGROTON) 25 MG tablet Take 25 mg by mouth daily.   Yes [provider]  predniSONE (DELTASONE) 10 MG tablet Take 10 mg by mouth daily.     Yes [provider]  Vitamin D,  Ergocalciferol, (DRISDOL) 50000 UNITS CAPS capsule Take 1 capsule (50,000 Units total) by mouth every 14 (fourteen) days. 03/05/13  Yes Regina Eck, CNM  naproxen (NAPROSYN) 500 MG tablet Take 1 tablet (500 mg total) by mouth 2 (two) times daily. 02/03/18   Maveric Debono, Elesa Hacker, PA-C    Family History Family History  Problem Relation Age of Onset  . Hypertension Mother   . Breast cancer Sister   . Hypertension Sister   . Hypertension Sister   . Stroke Sister   . Hypertension Sister     Social History Social History   Tobacco Use  . Smoking status: Never Smoker  Substance Use Topics  . Alcohol use: No  . Drug use: No     Allergies   Latex and Sulfa antibiotics   Review of Systems Review of Systems  Constitutional: Negative for fatigue and fever.  Eyes: Negative for visual disturbance.  Respiratory: Negative for shortness of breath.   Cardiovascular: Negative for chest pain.  Gastrointestinal:  Negative for abdominal pain, nausea and vomiting.  Musculoskeletal: Positive for arthralgias and joint swelling.  Skin: Positive for color change. Negative for rash and wound.  Neurological: Negative for dizziness, weakness, light-headedness and headaches.     Physical Exam Triage Vital Signs ED Triage Vitals [02/03/18 1041]  Enc Vitals Group     BP (!) 166/93     Pulse Rate 77     Resp 18     Temp 98 F (36.7 C)     Temp Source Oral     SpO2 100 %     Weight      Height      Head Circumference      Peak Flow      Pain Score 1     Pain Loc      Pain Edu?      Excl. in [place]?    No data found.  Updated Vital Signs BP (!) 166/93 (BP Location: Right Arm)   Pulse 77   Temp 98 F (36.7 C) (Oral)   Resp 18   LMP 05/01/1997   SpO2 100%   Visual Acuity Right Eye Distance:   Left Eye Distance:   Bilateral Distance:    Right Eye Near:   Left Eye Near:    Bilateral Near:     Physical Exam  Constitutional: She is oriented to person, place, and time. She  appears well-developed and well-nourished.  No acute distress  HENT:  Head: Normocephalic and atraumatic.  Nose: Nose normal.  Eyes: Conjunctivae are normal.  Neck: Neck supple.  Cardiovascular: Normal rate.  Pulmonary/Chest: Effort normal. No respiratory distress.  Abdominal: She exhibits no distension.  Musculoskeletal: Normal range of motion.  Moderate swelling with fluctuance overlying left elbow/bursa seems to be inflamed, minimal erythema and warmth overlying this area, swelling overlying proximal forearm movable into olecranon area Full active range of motion of elbow  Neurological: She is alert and oriented to person, place, and time.  Skin: Skin is warm and dry.  Psychiatric: She has a normal mood and affect.  Nursing note and vitals reviewed.    UC Treatments / Results  Labs (all labs ordered are listed, but only abnormal results are displayed) Labs Reviewed - No data to display  EKG None  Radiology No results found.  Procedures Procedures (including critical care time)  Medications Ordered in UC Medications - No data to display  Initial Impression / Assessment and Plan / UC Course  I have reviewed the triage vital signs and the nursing notes.  Pertinent labs & imaging results that were available during my care of the patient were reviewed by me and considered in my medical decision making (see chart for details).     Patient appears to have olecranon bursitis, will defer draining at this time.  Will treat conservatively with anti-inflammatories, Naprosyn provided and Ace wrap.  Continue to monitor symptoms.  Patient has full active range of motion and minimal redness and warmth, not concerning for cellulitis or gout.  Will have patient follow-up if these symptoms become more prominent.Discussed strict return precautions. Patient verbalized understanding and is agreeable with plan.  Final Clinical Impressions(s) / UC Diagnoses   Final diagnoses:  Olecranon  bursitis of left elbow     Discharge Instructions     Please take Naprosyn twice daily with food on your stomach over the next 1 to 2 weeks Please wear Ace wrap to also help with compression and decreasing swelling  Please follow-up if symptoms  not improving with consistently doing the above, developing increased redness, pain, swelling or difficulty moving arm   ED Prescriptions    Medication Sig Dispense Auth. Provider   naproxen (NAPROSYN) 500 MG tablet Take 1 tablet (500 mg total) by mouth 2 (two) times daily. 30 tablet [place] Curl, Wilmore C, PA-C     Controlled Substance Prescriptions [place] Controlled Substance Registry consulted? Not Applicable   Janith Lima, [place] 02/03/18 1132

## 2018-02-18 ENCOUNTER — Ambulatory Visit (HOSPITAL_COMMUNITY)
Admission: EM | Admit: 2018-02-18 | Discharge: 2018-02-18 | Disposition: A | Discharge status: Home / Self Care | Payer: Medicare Other | Attending: Family Medicine | Admitting: Family Medicine

## 2018-02-18 ENCOUNTER — Other Ambulatory Visit: Payer: Self-pay

## 2018-02-18 ENCOUNTER — Encounter (HOSPITAL_COMMUNITY): Payer: Self-pay | Admitting: *Deleted

## 2018-02-18 DIAGNOSIS — M7022 Olecranon bursitis, left elbow: Secondary | ICD-10-CM | POA: Diagnosis not present

## 2018-02-18 DIAGNOSIS — Z09 Encounter for follow-up examination after completed treatment for conditions other than malignant neoplasm: Secondary | ICD-10-CM

## 2018-02-18 NOTE — ED Provider Notes (Signed)
[place]    CSN: [id number] Arrival date & time: 02/18/18  1401     History   Chief Complaint Chief Complaint  Patient presents with  . Joint Swelling    HPI Lori Riley is a 67 y.o. female.   [name] presents for follow up in regards to her left elbow. Was seen here 10/6 and diagnosed with olecranon bursitis. She has been taking naproxen twice a day and using an ACE wrap which has helped. States still has a little tenderness if she rests her elbow directly down on something but overall feels improved. No longer with redness, swelling or any warmth. States does have some left shoulder pain, which she has received steroid injections for in the past. Has been taking her BP medications, states she is "always elevated." denies  Any chest pain , shortness of breath, headache, dizziness. Hx of gout, hpv, htn, lupus, OA.     ROS per HPI.      Past Medical History:  Diagnosis Date  . Anemia   . Gout    rt foot  . HPV (human papilloma virus) infection    negative genotype  . Hypertension   . Lupus ([place])   . Osteoarthritis   . Osteoporosis     Patient Active Problem List   Diagnosis Date Noted  . Lupus ([place])   . Osteoarthritis   . Gout     Past Surgical History:  Procedure Laterality Date  . CYSTOCELE REPAIR    . TOTAL HIP ARTHROPLASTY  2005   rt & left  . TUBAL LIGATION  1983    OB History    Gravida  3   Para  3   Term  3   Preterm      AB      Living  2     SAB      TAB      Ectopic      Multiple      Live Births  2            Home Medications    Prior to Admission medications   Medication Sig Start Date End Date Taking? Authorizing Provider  allopurinol (ZYLOPRIM) 100 MG tablet Take 100 mg by mouth daily.      [provider]  amLODipine (NORVASC) 2.5 MG tablet Take 2.5 mg by mouth daily.    [provider]  chlorthalidone (HYGROTON) 25 MG tablet Take 25 mg by mouth daily.    [provider]  naproxen (NAPROSYN) 500 MG tablet Take 1 tablet (500 mg total) by mouth 2 (two) times daily. 02/03/18   Wieters, Hallie C, PA-C  predniSONE (DELTASONE) 10 MG tablet Take 10 mg by mouth daily.      [provider]  Vitamin D, Ergocalciferol, (DRISDOL) 50000 UNITS CAPS capsule Take 1 capsule (50,000 Units total) by mouth every 14 (fourteen) days. 03/05/13   Regina Eck, CNM    Family History Family History  Problem Relation Age of Onset  . Hypertension Mother   . Breast cancer Sister   . Hypertension Sister   . Hypertension Sister   . Stroke Sister   . Hypertension Sister     Social History Social History   Tobacco Use  . Smoking status: Never Smoker  . Smokeless tobacco: Never Used  Substance Use Topics  . Alcohol use: No  . Drug use: No     Allergies   Latex and Sulfa antibiotics  Review of Systems Review of Systems   Physical Exam Triage Vital Signs ED Triage Vitals  Enc Vitals Group     BP 02/18/18 1507 (!) 180/75     Pulse Rate 02/18/18 1507 (!) 58     Resp 02/18/18 1507 16     Temp 02/18/18 1507 97.7 F (36.5 C)     Temp Source 02/18/18 1507 Oral     SpO2 02/18/18 1507 100 %     Weight --      Height --      Head Circumference --      Peak Flow --      Pain Score 02/18/18 1508 5     Pain Loc --      Pain Edu? --      Excl. in [place]? --    No data found.  Updated Vital Signs BP (!) 180/75 (BP Location: Right Arm)   Pulse (!) 58   Temp 97.7 F (36.5 C) (Oral)   Resp 16   LMP 05/01/1997   SpO2 100%    Physical Exam  Constitutional: She is oriented to person, place, and time. She appears well-developed and well-nourished. No distress.  Cardiovascular: Regular rhythm and normal heart sounds.  Pulmonary/Chest: Effort normal and breath sounds normal.  Musculoskeletal:       Left elbow: She exhibits normal range of motion, no swelling, no effusion, no deformity and no laceration. No tenderness found.  Full ROM to left elbow  without redness, swelling, tenderness   Neurological: She is alert and oriented to person, place, and time.  Skin: Skin is warm and dry.     UC Treatments / Results  Labs (all labs ordered are listed, but only abnormal results are displayed) Labs Reviewed - No data to display  EKG None  Radiology No results found.  Procedures Procedures (including critical care time)  Medications Ordered in UC Medications - No data to display  Initial Impression / Assessment and Plan / UC Course  I have reviewed the triage vital signs and the nursing notes.  Pertinent labs & imaging results that were available during my care of the patient were reviewed by me and considered in my medical decision making (see chart for details).     Reassuring follow up exam here today. Encouraged follow up with ortho as needed. Follow up with PCP for BP recheck. Patient verbalized understanding and agreeable to plan.   Final Clinical Impressions(s) / UC Diagnoses   Final diagnoses:  Follow-up examination     Discharge Instructions     I am happy to see your elbow has improved.  You may continue with use of Ace wrap as needed if develop redness or swelling.  Ice to the area.  No further medications indicated once complete previously prescribed course.  Please follow up with orthopedics for further evaluation as needed.  Please follow up with your primary care provider for recheck of your blood pressure.    ED Prescriptions    None     Controlled Substance Prescriptions [place] Controlled Substance Registry consulted? Not Applicable   Zigmund Gottron, NP 02/18/18 1552

## 2018-02-18 NOTE — ED Triage Notes (Signed)
States she was here 2 weeks ago and was told to return for followup states she had fluid on her left elbow.

## 2018-02-18 NOTE — Discharge Instructions (Signed)
I am happy to see your elbow has improved.  You may continue with use of Ace wrap as needed if develop redness or swelling.  Ice to the area.  No further medications indicated once complete previously prescribed course.  Please follow up with orthopedics for further evaluation as needed.  Please follow up with your primary care provider for recheck of your blood pressure.

## 2018-05-03 ENCOUNTER — Encounter (HOSPITAL_COMMUNITY): Payer: Self-pay | Admitting: Emergency Medicine

## 2018-05-03 ENCOUNTER — Telehealth (HOSPITAL_COMMUNITY): Payer: Self-pay | Admitting: Internal Medicine

## 2018-05-03 ENCOUNTER — Ambulatory Visit (HOSPITAL_COMMUNITY)
Admission: EM | Admit: 2018-05-03 | Discharge: 2018-05-03 | Disposition: A | Discharge status: Home / Self Care | Payer: Medicare Other | Attending: Internal Medicine | Admitting: Internal Medicine

## 2018-05-03 ENCOUNTER — Other Ambulatory Visit: Payer: Self-pay

## 2018-05-03 DIAGNOSIS — Z791 Long term (current) use of non-steroidal anti-inflammatories (NSAID): Secondary | ICD-10-CM | POA: Insufficient documentation

## 2018-05-03 DIAGNOSIS — Z91040 Latex allergy status: Secondary | ICD-10-CM | POA: Insufficient documentation

## 2018-05-03 DIAGNOSIS — M1000 Idiopathic gout, unspecified site: Secondary | ICD-10-CM | POA: Insufficient documentation

## 2018-05-03 DIAGNOSIS — Z882 Allergy status to sulfonamides status: Secondary | ICD-10-CM | POA: Insufficient documentation

## 2018-05-03 DIAGNOSIS — I10 Essential (primary) hypertension: Secondary | ICD-10-CM | POA: Diagnosis not present

## 2018-05-03 DIAGNOSIS — Z79899 Other long term (current) drug therapy: Secondary | ICD-10-CM | POA: Diagnosis not present

## 2018-05-03 DIAGNOSIS — Z8249 Family history of ischemic heart disease and other diseases of the circulatory system: Secondary | ICD-10-CM | POA: Diagnosis not present

## 2018-05-03 DIAGNOSIS — Z96643 Presence of artificial hip joint, bilateral: Secondary | ICD-10-CM | POA: Insufficient documentation

## 2018-05-03 DIAGNOSIS — Z7952 Long term (current) use of systemic steroids: Secondary | ICD-10-CM | POA: Insufficient documentation

## 2018-05-03 DIAGNOSIS — M10022 Idiopathic gout, left elbow: Secondary | ICD-10-CM | POA: Diagnosis not present

## 2018-05-03 LAB — SYNOVIAL FLUID, CRYSTAL: Crystals, Fluid: NONE SEEN

## 2018-05-03 MED ORDER — COLCHICINE 0.6 MG PO TABS: ORAL_TABLET | ORAL | 1 refills | Status: AC

## 2018-05-03 NOTE — Telephone Encounter (Signed)
Reordered canceled lab

## 2018-05-03 NOTE — ED Notes (Signed)
Patient able to ambulate independently  

## 2018-05-03 NOTE — ED Provider Notes (Addendum)
[place]    CSN: [id number] Arrival date & time: 05/03/18  1121     History   Chief Complaint Chief Complaint  Patient presents with  . elbow issue  . Follow-up    HPI Lori Riley is a 68 y.o. female.   68 year old female with lupus, osteoarthritis and gout presents to urgent care complaining of swelling of her left elbow.  Patient denies pain in the joint.  She was seen for this problem 2 months ago and it improved slightly with naproxen but has returned.  She denies trauma to the joint.  She also denies weakness or numbness in her arm or hand.     Past Medical History:  Diagnosis Date  . Anemia   . Gout    rt foot  . HPV (human papilloma virus) infection    negative genotype  . Hypertension   . Lupus ([place])   . Osteoarthritis   . Osteoporosis     Patient Active Problem List   Diagnosis Date Noted  . Lupus ([place])   . Osteoarthritis   . Gout     Past Surgical History:  Procedure Laterality Date  . CYSTOCELE REPAIR    . TOTAL HIP ARTHROPLASTY  2005   rt & left  . TUBAL LIGATION  1983    OB History    Gravida  3   Para  3   Term  3   Preterm      AB      Living  2     SAB      TAB      Ectopic      Multiple      Live Births  2            Home Medications    Prior to Admission medications   Medication Sig Start Date End Date Taking? Authorizing Provider  allopurinol (ZYLOPRIM) 100 MG tablet Take 100 mg by mouth daily.     Yes [provider]  amLODipine (NORVASC) 2.5 MG tablet Take 2.5 mg by mouth daily.   Yes [provider]  chlorthalidone (HYGROTON) 25 MG tablet Take 25 mg by mouth daily.   Yes [provider]  predniSONE (DELTASONE) 10 MG tablet Take 10 mg by mouth daily.     Yes [provider]  Vitamin D, Ergocalciferol, (DRISDOL) 50000 UNITS CAPS capsule Take 1 capsule (50,000 Units total) by mouth every 14 (fourteen) days. 03/05/13  Yes Regina Eck, CNM    colchicine 0.6 MG tablet Take 2 tabs po now, then 1 tab po one hour later.  Then take 1 tab po daily until joint swelling improved 05/03/18   Harrie Foreman, MD  naproxen (NAPROSYN) 500 MG tablet Take 1 tablet (500 mg total) by mouth 2 (two) times daily. 02/03/18   Wieters, Elesa Hacker, PA-C    Family History Family History  Problem Relation Age of Onset  . Hypertension Mother   . Breast cancer Sister   . Hypertension Sister   . Hypertension Sister   . Stroke Sister   . Hypertension Sister     Social History Social History   Tobacco Use  . Smoking status: Never Smoker  . Smokeless tobacco: Never Used  Substance Use Topics  . Alcohol use: No  . Drug use: No     Allergies   Latex and Sulfa antibiotics   Review of Systems Review of Systems  Constitutional: Negative for chills and fever.  HENT:  Negative for sore throat and tinnitus.   Eyes: Negative for redness.  Respiratory: Negative for cough and shortness of breath.   Cardiovascular: Negative for chest pain and palpitations.  Gastrointestinal: Negative for abdominal pain, diarrhea, nausea and vomiting.  Genitourinary: Negative for dysuria, frequency and urgency.  Musculoskeletal: Negative for myalgias.  Skin: Negative for rash.       No lesions  Neurological: Negative for weakness.  Hematological: Does not bruise/bleed easily.  Psychiatric/Behavioral: Negative for suicidal ideas.     Physical Exam Triage Vital Signs ED Triage Vitals  Enc Vitals Group     BP 05/03/18 1258 (!) 150/83     Pulse Rate 05/03/18 1258 74     Resp --      Temp 05/03/18 1258 98.1 F (36.7 C)     Temp Source 05/03/18 1258 Oral     SpO2 05/03/18 1258 100 %     Weight --      Height --      Head Circumference --      Peak Flow --      Pain Score 05/03/18 1300 6     Pain Loc --      Pain Edu? --      Excl. in [place]? --    No data found.  Updated Vital Signs BP (!) 150/83 (BP Location: Right Arm)   Pulse 74   Temp 98.1 F (36.7  C) (Oral)   LMP 05/01/1997   SpO2 100%   Visual Acuity Right Eye Distance:   Left Eye Distance:   Bilateral Distance:    Right Eye Near:   Left Eye Near:    Bilateral Near:     Physical Exam Vitals signs and nursing note reviewed.  Constitutional:      General: She is not in acute distress.    Appearance: She is well-developed.  HENT:     Head: Normocephalic and atraumatic.  Eyes:     General: No scleral icterus.    Conjunctiva/sclera: Conjunctivae normal.     Pupils: Pupils are equal, round, and reactive to light.  Neck:     Musculoskeletal: Normal range of motion and neck supple.     Thyroid: No thyromegaly.     Vascular: No JVD.     Trachea: No tracheal deviation.  Cardiovascular:     Rate and Rhythm: Normal rate and regular rhythm.     Heart sounds: Normal heart sounds. No murmur. No friction rub. No gallop.   Pulmonary:     Effort: Pulmonary effort is normal.     Breath sounds: Normal breath sounds.  Abdominal:     General: Bowel sounds are normal. There is no distension.     Palpations: Abdomen is soft.     Tenderness: There is no abdominal tenderness.  Musculoskeletal: Normal range of motion.       Arms:  Lymphadenopathy:     Cervical: No cervical adenopathy.  Skin:    General: Skin is warm and dry.  Neurological:     Mental Status: She is alert and oriented to person, place, and time.     Cranial Nerves: No cranial nerve deficit.  Psychiatric:        Behavior: Behavior normal.        Thought Content: Thought content normal.        Judgment: Judgment normal.      UC Treatments / Results  Labs (all labs ordered are listed, but only abnormal results are displayed) Labs Reviewed  BODY  FLUID CULTURE  SYNOVIAL FLUID, CRYSTAL    EKG None  Radiology No results found.  Procedures Join Aspiration/Injection Date/Time: 05/03/2018 2:51 PM Performed by: Harrie Foreman, MD Authorized by: Harrie Foreman, MD   Consent:    Consent obtained:   Verbal   Consent given by:  Patient   Risks discussed:  Bleeding, infection and incomplete drainage   Alternatives discussed:  No treatment Location:    Location:  Elbow   Elbow:  L elbow Anesthesia (see MAR for exact dosages):    Anesthesia method:  None Procedure details:    Needle gauge:  22 G   Ultrasound guidance: no     Approach:  Posterior   Aspirate characteristics:  Yellow   Steroid injected: no     Specimen collected: yes   Post-procedure details:    Dressing:  Adhesive bandage   Patient tolerance of procedure:  Tolerated well, no immediate complications   (including critical care time)  Medications Ordered in UC Medications - No data to display  Initial Impression / Assessment and Plan / UC Course  I have reviewed the triage vital signs and the nursing notes.  Pertinent labs & imaging results that were available during my care of the patient were reviewed by me and considered in my medical decision making (see chart for details).     Uncomplicated aspiration of straw-colored nonpurulent non-turbid fluid consistent with gout.  Prescribed colchicine.  Fluid sent for crystal studies and culture.  Final Clinical Impressions(s) / UC Diagnoses   Final diagnoses:  Acute idiopathic gout of left elbow   Discharge Instructions   None    ED Prescriptions    Medication Sig Dispense Auth. Provider   colchicine 0.6 MG tablet Take 2 tabs po now, then 1 tab po one hour later.  Then take 1 tab po daily until joint swelling improved 15 tablet Harrie Foreman, MD     Controlled Substance Prescriptions [place] Controlled Substance Registry consulted? Not Applicable   Harrie Foreman, MD 05/03/18 1452    Harrie Foreman, MD 05/03/18 1452

## 2018-05-03 NOTE — ED Triage Notes (Signed)
Pt has been having recurrent issues with her left elbow.  Pt was seen twice in October for swelling/fluid in the left elbow.

## 2018-05-07 ENCOUNTER — Telehealth (HOSPITAL_COMMUNITY): Payer: Self-pay | Admitting: Emergency Medicine

## 2018-05-07 LAB — BODY FLUID CULTURE: Culture: NO GROWTH

## 2018-05-07 NOTE — Telephone Encounter (Signed)
No growth. Attempted to contact patient. No answer.

## 2018-06-10 DIAGNOSIS — L989 Disorder of the skin and subcutaneous tissue, unspecified: Secondary | ICD-10-CM | POA: Diagnosis not present

## 2018-06-10 DIAGNOSIS — I119 Hypertensive heart disease without heart failure: Secondary | ICD-10-CM | POA: Diagnosis not present

## 2018-06-10 DIAGNOSIS — D509 Iron deficiency anemia, unspecified: Secondary | ICD-10-CM | POA: Diagnosis not present

## 2018-06-10 DIAGNOSIS — M2550 Pain in unspecified joint: Secondary | ICD-10-CM | POA: Diagnosis not present

## 2018-06-10 DIAGNOSIS — N182 Chronic kidney disease, stage 2 (mild): Secondary | ICD-10-CM | POA: Diagnosis not present

## 2018-06-10 DIAGNOSIS — M3214 Glomerular disease in systemic lupus erythematosus: Secondary | ICD-10-CM | POA: Diagnosis not present

## 2018-06-10 DIAGNOSIS — E7800 Pure hypercholesterolemia, unspecified: Secondary | ICD-10-CM | POA: Diagnosis not present

## 2018-06-10 DIAGNOSIS — E559 Vitamin D deficiency, unspecified: Secondary | ICD-10-CM | POA: Diagnosis not present

## 2018-06-10 DIAGNOSIS — Z79899 Other long term (current) drug therapy: Secondary | ICD-10-CM | POA: Diagnosis not present

## 2018-09-02 DIAGNOSIS — D509 Iron deficiency anemia, unspecified: Secondary | ICD-10-CM | POA: Diagnosis not present

## 2018-09-02 DIAGNOSIS — I119 Hypertensive heart disease without heart failure: Secondary | ICD-10-CM | POA: Diagnosis not present

## 2018-09-02 DIAGNOSIS — N182 Chronic kidney disease, stage 2 (mild): Secondary | ICD-10-CM | POA: Diagnosis not present

## 2018-09-02 DIAGNOSIS — E7800 Pure hypercholesterolemia, unspecified: Secondary | ICD-10-CM | POA: Diagnosis not present

## 2018-09-02 DIAGNOSIS — M3214 Glomerular disease in systemic lupus erythematosus: Secondary | ICD-10-CM | POA: Diagnosis not present

## 2018-09-02 DIAGNOSIS — L942 Calcinosis cutis: Secondary | ICD-10-CM | POA: Diagnosis not present

## 2018-09-02 DIAGNOSIS — Z79899 Other long term (current) drug therapy: Secondary | ICD-10-CM | POA: Diagnosis not present

## 2018-09-02 DIAGNOSIS — M2550 Pain in unspecified joint: Secondary | ICD-10-CM | POA: Diagnosis not present

## 2018-10-07 DIAGNOSIS — E7800 Pure hypercholesterolemia, unspecified: Secondary | ICD-10-CM | POA: Diagnosis not present

## 2018-10-07 DIAGNOSIS — D509 Iron deficiency anemia, unspecified: Secondary | ICD-10-CM | POA: Diagnosis not present

## 2018-10-07 DIAGNOSIS — N182 Chronic kidney disease, stage 2 (mild): Secondary | ICD-10-CM | POA: Diagnosis not present

## 2018-10-07 DIAGNOSIS — M2550 Pain in unspecified joint: Secondary | ICD-10-CM | POA: Diagnosis not present

## 2018-10-07 DIAGNOSIS — I119 Hypertensive heart disease without heart failure: Secondary | ICD-10-CM | POA: Diagnosis not present

## 2018-10-07 DIAGNOSIS — M3214 Glomerular disease in systemic lupus erythematosus: Secondary | ICD-10-CM | POA: Diagnosis not present

## 2018-10-07 DIAGNOSIS — Z79899 Other long term (current) drug therapy: Secondary | ICD-10-CM | POA: Diagnosis not present

## 2018-10-07 DIAGNOSIS — L989 Disorder of the skin and subcutaneous tissue, unspecified: Secondary | ICD-10-CM | POA: Diagnosis not present

## 2018-10-07 DIAGNOSIS — Z139 Encounter for screening, unspecified: Secondary | ICD-10-CM | POA: Diagnosis not present

## 2018-10-07 DIAGNOSIS — M25569 Pain in unspecified knee: Secondary | ICD-10-CM | POA: Diagnosis not present

## 2019-02-10 DIAGNOSIS — I119 Hypertensive heart disease without heart failure: Secondary | ICD-10-CM | POA: Diagnosis not present

## 2019-02-10 DIAGNOSIS — N182 Chronic kidney disease, stage 2 (mild): Secondary | ICD-10-CM | POA: Diagnosis not present

## 2019-02-10 DIAGNOSIS — Z79899 Other long term (current) drug therapy: Secondary | ICD-10-CM | POA: Diagnosis not present

## 2019-02-10 DIAGNOSIS — M3214 Glomerular disease in systemic lupus erythematosus: Secondary | ICD-10-CM | POA: Diagnosis not present

## 2019-02-10 DIAGNOSIS — E559 Vitamin D deficiency, unspecified: Secondary | ICD-10-CM | POA: Diagnosis not present

## 2019-02-10 DIAGNOSIS — D509 Iron deficiency anemia, unspecified: Secondary | ICD-10-CM | POA: Diagnosis not present

## 2019-02-10 DIAGNOSIS — L942 Calcinosis cutis: Secondary | ICD-10-CM | POA: Diagnosis not present

## 2019-02-10 DIAGNOSIS — M2550 Pain in unspecified joint: Secondary | ICD-10-CM | POA: Diagnosis not present

## 2019-02-10 DIAGNOSIS — E7800 Pure hypercholesterolemia, unspecified: Secondary | ICD-10-CM | POA: Diagnosis not present

## 2019-05-28 NOTE — Congregational Nurse Program (Signed)
Saw Lori Riley at walk-in clinic of [facility] for B/P check and glucose monitoring. Both w/i normal range. Glucose #117 b/p140/82 Counciled on advantages of receiving Covid vaccine

## 2019-06-12 ENCOUNTER — Emergency Department (HOSPITAL_COMMUNITY): Payer: Medicare Other

## 2019-06-12 ENCOUNTER — Encounter (HOSPITAL_COMMUNITY): Payer: Self-pay | Admitting: Emergency Medicine

## 2019-06-12 ENCOUNTER — Other Ambulatory Visit: Payer: Self-pay

## 2019-06-12 ENCOUNTER — Emergency Department (HOSPITAL_COMMUNITY)
Admission: EM | Admit: 2019-06-12 | Discharge: 2019-06-12 | Disposition: A | Discharge status: Home / Self Care | Payer: Medicare Other | Attending: Emergency Medicine | Admitting: Emergency Medicine

## 2019-06-12 DIAGNOSIS — Z79899 Other long term (current) drug therapy: Secondary | ICD-10-CM | POA: Diagnosis not present

## 2019-06-12 DIAGNOSIS — R110 Nausea: Secondary | ICD-10-CM | POA: Diagnosis not present

## 2019-06-12 DIAGNOSIS — R55 Syncope and collapse: Secondary | ICD-10-CM | POA: Diagnosis present

## 2019-06-12 DIAGNOSIS — I10 Essential (primary) hypertension: Secondary | ICD-10-CM | POA: Diagnosis not present

## 2019-06-12 DIAGNOSIS — M3210 Systemic lupus erythematosus, organ or system involvement unspecified: Secondary | ICD-10-CM | POA: Insufficient documentation

## 2019-06-12 DIAGNOSIS — D352 Benign neoplasm of pituitary gland: Secondary | ICD-10-CM | POA: Diagnosis not present

## 2019-06-12 LAB — URINALYSIS, ROUTINE W REFLEX MICROSCOPIC
Bilirubin Urine: NEGATIVE
Glucose, UA: NEGATIVE mg/dL
Hgb urine dipstick: NEGATIVE
Ketones, ur: NEGATIVE mg/dL
Leukocytes,Ua: NEGATIVE
Nitrite: NEGATIVE
Protein, ur: NEGATIVE mg/dL
Specific Gravity, Urine: 1.015 (ref 1.005–1.030)
pH: 6 (ref 5.0–8.0)

## 2019-06-12 LAB — BASIC METABOLIC PANEL
Anion gap: 11 (ref 5–15)
BUN: 19 mg/dL (ref 8–23)
CO2: 27 mmol/L (ref 22–32)
Calcium: 9.3 mg/dL (ref 8.9–10.3)
Chloride: 104 mmol/L (ref 98–111)
Creatinine, Ser: 1.34 mg/dL — ABNORMAL HIGH (ref 0.44–1.00)
GFR calc Af Amer: 48 mL/min — ABNORMAL LOW (ref >60)
GFR calc non Af Amer: 41 mL/min — ABNORMAL LOW (ref >60)
Glucose, Bld: 109 mg/dL — ABNORMAL HIGH (ref 70–99)
Potassium: 3.3 mmol/L — ABNORMAL LOW (ref 3.5–5.1)
Sodium: 142 mmol/L (ref 135–145)

## 2019-06-12 LAB — CBG MONITORING, ED: Glucose-Capillary: 78 mg/dL (ref 70–99)

## 2019-06-12 LAB — CBC
HCT: 44.3 % (ref 36.0–46.0)
Hemoglobin: 14.2 g/dL (ref 12.0–15.0)
MCH: 29.8 pg (ref 26.0–34.0)
MCHC: 32.1 g/dL (ref 30.0–36.0)
MCV: 93.1 fL (ref 80.0–100.0)
Platelets: 265 10*3/uL (ref 150–400)
RBC: 4.76 MIL/uL (ref 3.87–5.11)
RDW: 14 % (ref 11.5–15.5)
WBC: 6.3 10*3/uL (ref 4.0–10.5)
nRBC: 0 % (ref 0.0–0.2)

## 2019-06-12 MED ORDER — POTASSIUM CHLORIDE CRYS ER 20 MEQ PO TBCR
40.0000 meq | EXTENDED_RELEASE_TABLET | Freq: Once | ORAL | Status: AC
  Administered 2019-06-12: 40 meq via ORAL
  Filled 2019-06-12: qty 2

## 2019-06-12 MED ORDER — GADOBUTROL 1 MMOL/ML IV SOLN
8.0000 mL | Freq: Once | INTRAVENOUS | Status: AC | PRN
  Administered 2019-06-12: 22:00:00 8 mL via INTRAVENOUS

## 2019-06-12 NOTE — ED Provider Notes (Signed)
[facility] EMERGENCY DEPARTMENT Provider Note   CSN: [id number] Arrival date & time: 06/12/19  1439     History Chief Complaint  Patient presents with  . Loss of Consciousness    Lori Riley is a 69 y.o. female.  69 year old female with prior medical history as detailed below presents for evaluation of syncope.  Patient reports that she was at home.  She reports feeling a wave of nausea.  She went to her kitchen to get soup.  She then passed out.  She was found on the floor by family.  She is unable to recall exactly how long she was unconscious.  She denies current complaint.  She specifically denies current chest pain, shortness of breath, current nausea, or other specific complaint.  Other than nausea associated with the syncope she denies other symptoms such as chest pain, palpitations, or other complaint.  The history is provided by the patient and medical records.  Loss of Consciousness Episode history:  Single Most recent episode:  Today Timing:  Rare Progression:  Resolved Chronicity:  New Context comment:  Associated with nausea Witnessed: no   Relieved by:  Nothing Worsened by:  Nothing Associated symptoms: nausea   Associated symptoms: no chest pain, no headaches and no malaise/fatigue        Past Medical History:  Diagnosis Date  . Anemia   . Gout    rt foot  . HPV (human papilloma virus) infection    negative genotype  . Hypertension   . Lupus ([place])   . Osteoarthritis   . Osteoporosis     Patient Active Problem List   Diagnosis Date Noted  . Lupus ([place])   . Osteoarthritis   . Gout     Past Surgical History:  Procedure Laterality Date  . CYSTOCELE REPAIR    . TOTAL HIP ARTHROPLASTY  2005   rt & left  . TUBAL LIGATION  1983     OB History    Gravida  3   Para  3   Term  3   Preterm      AB      Living  2     SAB      TAB      Ectopic      Multiple      Live Births  2           Family History   Problem Relation Age of Onset  . Hypertension Mother   . Breast cancer Sister   . Hypertension Sister   . Hypertension Sister   . Stroke Sister   . Hypertension Sister     Social History   Tobacco Use  . Smoking status: Never Smoker  . Smokeless tobacco: Never Used  Substance Use Topics  . Alcohol use: No  . Drug use: No    Home Medications Prior to Admission medications   Medication Sig Start Date End Date Taking? Authorizing Provider  allopurinol (ZYLOPRIM) 100 MG tablet Take 100 mg by mouth daily.      [provider]  amLODipine (NORVASC) 2.5 MG tablet Take 2.5 mg by mouth daily.    [provider]  chlorthalidone (HYGROTON) 25 MG tablet Take 25 mg by mouth daily.    [provider]  colchicine 0.6 MG tablet Take 2 tabs po now, then 1 tab po one hour later.  Then take 1 tab po daily until joint swelling improved 05/03/18   Harrie Foreman, MD  naproxen (NAPROSYN) 500 MG tablet Take 1 tablet (500 mg total) by mouth 2 (two) times daily. 02/03/18   Wieters, Hallie C, PA-C  predniSONE (DELTASONE) 10 MG tablet Take 10 mg by mouth daily.      [provider]  Vitamin D, Ergocalciferol, (DRISDOL) 50000 UNITS CAPS capsule Take 1 capsule (50,000 Units total) by mouth every 14 (fourteen) days. 03/05/13   Regina Eck, CNM    Allergies    Latex and Sulfa antibiotics  Review of Systems   Review of Systems  Constitutional: Negative for malaise/fatigue.  Cardiovascular: Positive for syncope. Negative for chest pain.  Gastrointestinal: Positive for nausea.  Neurological: Negative for headaches.  All other systems reviewed and are negative.   Physical Exam Updated Vital Signs BP (!) 162/86 (BP Location: Right Arm)   Pulse 86   Temp 98.3 F (36.8 C) (Oral)   Resp 14   LMP 05/01/1997   SpO2 100%   Physical Exam Vitals and nursing note reviewed.  Constitutional:      General: She is not in acute distress.    Appearance: Normal  appearance. She is well-developed.  HENT:     Head: Normocephalic and atraumatic.  Eyes:     Conjunctiva/sclera: Conjunctivae normal.     Pupils: Pupils are equal, round, and reactive to light.  Cardiovascular:     Rate and Rhythm: Normal rate and regular rhythm.     Heart sounds: Normal heart sounds.  Pulmonary:     Effort: Pulmonary effort is normal. No respiratory distress.     Breath sounds: Normal breath sounds.  Abdominal:     General: There is no distension.     Palpations: Abdomen is soft.     Tenderness: There is no abdominal tenderness.  Musculoskeletal:        General: No deformity. Normal range of motion.     Cervical back: Normal range of motion and neck supple.  Skin:    General: Skin is warm and dry.  Neurological:     General: No focal deficit present.     Mental Status: She is alert and oriented to person, place, and time. Mental status is at baseline.     Cranial Nerves: No cranial nerve deficit.     Sensory: No sensory deficit.     Motor: No weakness.     Coordination: Coordination normal.     ED Results / Procedures / Treatments   Labs (all labs ordered are listed, but only abnormal results are displayed) Labs Reviewed  BASIC METABOLIC PANEL - Abnormal; Notable for the following components:      Result Value   Potassium 3.3 (*)    Glucose, Bld 109 (*)    Creatinine, Ser 1.34 (*)    GFR calc non Af Amer 41 (*)    GFR calc Af Amer 48 (*)    All other components within normal limits  URINALYSIS, ROUTINE W REFLEX MICROSCOPIC - Abnormal; Notable for the following components:   APPearance HAZY (*)    All other components within normal limits  CBC  CBG MONITORING, ED    EKG EKG Interpretation  Date/Time:  Thursday June 12 2019 14:46:52 EST Ventricular Rate:  87 PR Interval:  120 QRS Duration: 88 QT Interval:  358 QTC Calculation: 430 R Axis:   -21 Text Interpretation: Normal sinus rhythm Possible Left atrial enlargement Left ventricular  hypertrophy with repolarization abnormality ( R in aVL , Cornell product , Romhilt-Estes ) Cannot rule out Septal infarct ,  age undetermined Abnormal ECG Confirmed by Dene Gentry [phone]) on 06/12/2019 4:14:33 PM   Radiology CT Head Wo Contrast  Result Date: 06/12/2019 CLINICAL DATA:  Syncope with head trauma EXAM: CT HEAD WITHOUT CONTRAST TECHNIQUE: Contiguous axial images were obtained from the base of the skull through the vertex without intravenous contrast. COMPARISON:  None. FINDINGS: Brain: There is no hemorrhage or extra-axial collection. The size and configuration of the ventricles and extra-axial CSF spaces are normal. There is hypoattenuation of the white matter, most commonly indicating chronic small vessel disease. There is parenchymal calcification within the basal ganglia and medial cerebellar hemispheres. There is an intra sellar mass measuring 1.7 x 1.3 cm. Vascular: No abnormal hyperdensity of the major intracranial arteries or dural venous sinuses. No intracranial atherosclerosis. Skull: The visualized skull base, calvarium and extracranial soft tissues are normal. Sinuses/Orbits: No fluid levels or advanced mucosal thickening of the visualized paranasal sinuses. No mastoid or middle ear effusion. The orbits are normal. IMPRESSION: 1. No acute intracranial abnormality. 2. Intra sellar mass, likely a pituitary macroadenoma. MRI with and without contrast is recommended for better characterization. 3. Chronic small vessel disease. Electronically Signed   By: Ulyses Jarred M.D.   On: 06/12/2019 19:22   MR Brain W and Wo Contrast  Result Date: 06/12/2019 CLINICAL DATA:  69 year old female with syncopal episode. Intra sellar mass on plain head CT earlier today. EXAM: MRI HEAD WITHOUT AND WITH CONTRAST TECHNIQUE: Multiplanar, multiecho pulse sequences of the brain and surrounding structures were obtained without and with intravenous contrast. CONTRAST:  20mL GADAVIST GADOBUTROL 1 MMOL/ML IV  SOLN COMPARISON:  Head CT 1900 hours today. FINDINGS: Brain: Pituitary details are below. No restricted diffusion to suggest acute infarction. No midline shift, ventriculomegaly, extra-axial fluid collection or acute intracranial hemorrhage. Cervicomedullary junction within normal limits. Patchy mild to moderate for age cerebral white matter T2 and FLAIR hyperintensity which is more pronounced in the left hemisphere, with evidence of a chronic left corona radiata lacunar infarct on series 5, image 15. No cortical encephalomalacia identified. No definite chronic cerebral blood products. Calcifications of the cerebral deep gray and deep cerebellar nuclei again noted. No abnormal gray or white matter enhancement identified. No definite dural thickening. Vascular: Major intracranial vascular flow voids are preserved. The right vertebral artery appears dominant. The major dural venous sinuses are enhancing and appear to be patent. Skull and upper cervical spine: Nonspecific densely sclerotic focus in the posterior C5 vertebra on series 4, image 14, with normal visible bone marrow signal elsewhere. The clivus remains intact. Sinuses/Orbits: Optic chiasm described below. Intraorbital soft tissues are negative. Mild to moderate ethmoid sinus mucosal thickening. Mastoids are well pneumatized. Visible internal auditory structures appear normal. Other: No normal pituitary parenchyma is identified, and an oval mildly heterogeneous T1 hypointense and enhancing soft tissue mass expands the bony sella and extends into the suprasellar cistern encompassing up to 15 by 23 x 17 mm (AP by transverse by CC). No associated flow void or pulsation artifact. The lesion is heterogeneously hyperintense on FLAIR and ranges from low signal intensity to high signal on T2 with diffuse enhancement. There is lateral displacement of the right ICA siphon (series 8, image 16) and possible early right cavernous sinus involvement. Suprasellar extension  results in some mass effect on the optic chiasm (series 11, image 19). IMPRESSION: 1. Up to 2.3 cm enhancing intra sellar soft tissue mass with suprasellar extension is most compatible with a Pituitary Macroadenoma. Still, an intracranial MRA prior to surgery would  be complementary to this routine protocol brain study to fully exclude the possibility of a large aneurysm. Early involvement of the right cavernous sinus suspected. Mild mass effect on the optic chiasm. 2. No other acute intracranial abnormality. Evidence of chronic small vessel disease in the cerebral white matter. 3. Indeterminate sclerotic bone lesion in the posterior C5 vertebra. Visible bone marrow signal elsewhere is normal arguing against sclerotic metastatic disease. Follow-up options include Cervical Spine MRI versus a Nuclear Medicine whole-body bone scan. Electronically Signed   By: Genevie Ann M.D.   On: 06/12/2019 22:00   DG Chest Port 1 View  Result Date: 06/12/2019 CLINICAL DATA:  Pt states she fainted in the kitchen today around 12:30. No chest pain or sob. Nonsmoker. No previous heart or lung conditions. No surgeries to heart or lungs. Hx of HTN. EXAM: PORTABLE CHEST 1 VIEW COMPARISON:  Chest radiograph 02/22/2011 FINDINGS: The heart size and mediastinal contours are within normal limits. The lungs are clear. No pneumothorax or large pleural effusion. The visualized skeletal structures are unremarkable. IMPRESSION: No acute cardiopulmonary disease. Electronically Signed   By: Audie Pinto M.D.   On: 06/12/2019 16:37    Procedures Procedures (including critical care time)  Medications Ordered in ED Medications - No data to display  ED Course  I have reviewed the triage vital signs and the nursing notes.  Pertinent labs & imaging results that were available during my care of the patient were reviewed by me and considered in my medical decision making (see chart for details).    MDM Rules/Calculators/A&P                       MDM  Screen complete  Lori Riley was evaluated in Emergency Department on 06/12/2019 for the symptoms described in the history of present illness. She was evaluated in the context of the global COVID-19 pandemic, which necessitated consideration that the patient might be at risk for infection with the SARS-CoV-2 virus that causes COVID-19. Institutional protocols and algorithms that pertain to the evaluation of patients at risk for COVID-19 are in a state of rapid change based on information released by regulatory bodies including the CDC and federal and state organizations. These policies and algorithms were followed during the patient's care in the ED.   Patient is presenting for evaluation following reported syncopal episode.  Patient's reported symptoms occurred in the setting of nausea and then syncope.  Her presentation is consistent with likely vasovagal syncope.  Incidental finding on CT of the head of likely intrasellar adenoma.  Case discussed with neurology Rory Percy) who recommends MRI imaging tonight with likely outpatient neurosurgical follow-up arranged.  It is unlikely that the suspected adenoma is the cause of the patient's reported syncopal episode.  MRI shows evidence of pituitary adenoma.  Case discussed with Dr. Christella Noa of neurosurgery.  He agrees with plan for outpatient follow-up.    Patient does understand need for close follow-up.  Strict return precautions given and understood.   Final Clinical Impression(s) / ED Diagnoses Final diagnoses:  Syncope, unspecified syncope type    Rx / DC Orders ED Discharge Orders    None       Valarie Merino, MD 06/12/19 2300

## 2019-06-12 NOTE — Discharge Instructions (Addendum)
Return for any problem.  Follow-up with your regular care provider as instructed.  You were identified as having a pituitary adenoma.  This requires close follow-up with neurosurgery.  Please contact Dr. Christella Noa at [facility] as instructed.

## 2019-06-12 NOTE — ED Notes (Signed)
Pt resting, watching T V   No complaints at this time

## 2019-06-12 NOTE — ED Notes (Signed)
Pt given verbal and written d/c instructions. Verbalized understanding

## 2019-06-12 NOTE — ED Notes (Signed)
Pt to MRI at this time.

## 2019-06-12 NOTE — ED Notes (Signed)
Pt remains in MRI at this time  

## 2019-06-12 NOTE — ED Triage Notes (Signed)
Per [facility] pt coming from home where family reports pt had a syncopal episode. Patient c/o dizziness and nausea. States did not take BP medications today, was standing in the kitchen trying to make herself some food when episode occurred.

## 2019-07-16 NOTE — Congregational Nurse Program (Signed)
Lori Riley was seen at [facility] walk-in clinic for emotional support and advice. Appeared anxious and easily self disclosed about feelings of isolation and needing to socialized with friends. Lori Riley received her second vaccination and cautioned on continued CDC recommendations on distancing , wearing masks and small gatherings. Met with client for 1hr,, she left stating her anxiety has lessened, encouraged to call CN when needed.

## 2019-08-05 NOTE — Congregational Nurse Program (Signed)
Saw Mrs. Gaspar at walk-in clinic this afternoon for B/P check and f/s of 93. States she received her 2nd Covid vaccination and vacationed in [place] 2 weeks ago. No further f/u required at this time.

## 2019-09-24 ENCOUNTER — Other Ambulatory Visit: Payer: Self-pay

## 2019-09-24 ENCOUNTER — Ambulatory Visit (HOSPITAL_COMMUNITY)
Admission: EM | Admit: 2019-09-24 | Discharge: 2019-09-24 | Disposition: A | Discharge status: Home / Self Care | Payer: Medicare Other | Attending: Family Medicine | Admitting: Family Medicine

## 2019-09-24 ENCOUNTER — Encounter (HOSPITAL_COMMUNITY): Payer: Self-pay | Admitting: Emergency Medicine

## 2019-09-24 DIAGNOSIS — N183 Chronic kidney disease, stage 3 unspecified: Secondary | ICD-10-CM | POA: Diagnosis present

## 2019-09-24 DIAGNOSIS — R1030 Lower abdominal pain, unspecified: Secondary | ICD-10-CM | POA: Diagnosis present

## 2019-09-24 DIAGNOSIS — N3001 Acute cystitis with hematuria: Secondary | ICD-10-CM

## 2019-09-24 DIAGNOSIS — R300 Dysuria: Secondary | ICD-10-CM | POA: Diagnosis present

## 2019-09-24 DIAGNOSIS — R350 Frequency of micturition: Secondary | ICD-10-CM | POA: Insufficient documentation

## 2019-09-24 MED ORDER — CEPHALEXIN 500 MG PO CAPS: 500.0000 mg | ORAL_CAPSULE | Freq: Two times a day (BID) | ORAL | 0 refills | Status: DC

## 2019-09-24 NOTE — ED Triage Notes (Signed)
Onset of symptoms was Saturday 09/20/2019.  Patient has lower abdominal pain and pressure., fatigue.  Poor appetite, no sense of taste.    Patient did get covid vaccines-march 2021

## 2019-09-24 NOTE — ED Notes (Signed)
Urinalysis testing exceeded the limitations of the [employer] capabilities. Notified provider. Urine culture ordered 

## 2019-09-24 NOTE — ED Provider Notes (Signed)
[facility]   MRN: [id number] DOB: 1951/01/27  Subjective:   Lori Riley is a 69 y.o. female with pmh of hx of CKD, lupus, gout, HTN, OA, osteoporosis presenting for 3-5 day hx of dysuria, intermittent mild pelvic cramping, fatigue, urinary frequency. Has tried to drink ginger ale, sprite. Has had decreased appetite. Reports hx of a UTI and feels like this is the same thing. Patient has been unable to provide urine sample because she missed the specimen collection tube. Had her COVID vaccination completed in March 2021.   No current facility-administered medications for this encounter.  Current Outpatient Medications:  .  allopurinol (ZYLOPRIM) 100 MG tablet, Take 100 mg by mouth daily.  , Disp: , Rfl:  .  amLODipine (NORVASC) 2.5 MG tablet, Take 2.5 mg by mouth daily., Disp: , Rfl:  .  cabergoline (DOSTINEX) 0.5 MG tablet, Take 0.25 mg by mouth 2 (two) times a week., Disp: , Rfl:  .  chlorthalidone (HYGROTON) 25 MG tablet, Take 25 mg by mouth daily., Disp: , Rfl:  .  predniSONE (DELTASONE) 10 MG tablet, Take 10 mg by mouth daily.  , Disp: , Rfl:  .  Vitamin D, Ergocalciferol, (DRISDOL) 50000 UNITS CAPS capsule, Take 1 capsule (50,000 Units total) by mouth every 14 (fourteen) days., Disp: 30 capsule, Rfl: 12 .  colchicine 0.6 MG tablet, Take 2 tabs po now, then 1 tab po one hour later.  Then take 1 tab po daily until joint swelling improved, Disp: 15 tablet, Rfl: 1 .  naproxen (NAPROSYN) 500 MG tablet, Take 1 tablet (500 mg total) by mouth 2 (two) times daily., Disp: 30 tablet, Rfl: 0   Allergies  Allergen Reactions  . Latex Itching  . Sulfa Antibiotics Itching    Past Medical History:  Diagnosis Date  . Anemia   . Gout    rt foot  . HPV (human papilloma virus) infection    negative genotype  . Hypertension   . Lupus ([place])   . Osteoarthritis   . Osteoporosis      Past Surgical History:  Procedure Laterality Date  . CYSTOCELE REPAIR    . TOTAL HIP ARTHROPLASTY   2005   rt & left  . TUBAL LIGATION  1983    Family History  Problem Relation Age of Onset  . Hypertension Mother   . Breast cancer Sister   . Hypertension Sister   . Hypertension Sister   . Stroke Sister   . Hypertension Sister     Social History   Tobacco Use  . Smoking status: Never Smoker  . Smokeless tobacco: Never Used  Substance Use Topics  . Alcohol use: No  . Drug use: No    ROS   Objective:   Vitals: BP 123/77 (BP Location: Left Arm)   Pulse 92   Temp 98.8 F (37.1 C) (Oral)   Resp 18   LMP 05/01/1997   SpO2 98%   Physical Exam Constitutional:      General: She is not in acute distress.    Appearance: Normal appearance. She is well-developed and normal weight. She is not ill-appearing, toxic-appearing or diaphoretic.  HENT:     Head: Normocephalic and atraumatic.     Right Ear: External ear normal.     Left Ear: External ear normal.     Nose: Nose normal.     Mouth/Throat:     Mouth: Mucous membranes are moist.     Pharynx: Oropharynx is clear.  Eyes:  General: No scleral icterus.    Extraocular Movements: Extraocular movements intact.     Pupils: Pupils are equal, round, and reactive to light.  Cardiovascular:     Rate and Rhythm: Normal rate and regular rhythm.     Pulses: Normal pulses.     Heart sounds: Normal heart sounds. No murmur. No friction rub. No gallop.   Pulmonary:     Effort: Pulmonary effort is normal. No respiratory distress.     Breath sounds: Normal breath sounds. No stridor. No wheezing, rhonchi or rales.  Abdominal:     General: Bowel sounds are normal. There is no distension.     Palpations: Abdomen is soft. There is no mass.     Tenderness: There is no abdominal tenderness. There is no right CVA tenderness, left CVA tenderness, guarding or rebound.    Skin:    General: Skin is warm and dry.     Coloration: Skin is not pale.     Findings: No rash.  Neurological:     General: No focal deficit present.      Mental Status: She is alert and oriented to person, place, and time.  Psychiatric:        Mood and Affect: Mood normal.        Behavior: Behavior normal.        Thought Content: Thought content normal.        Judgment: Judgment normal.    Urinalysis was panpositive by verbal report and was kicked out due to elevated leukocytes.   Assessment and Plan :   PDMP not reviewed this encounter.  1. Acute cystitis with hematuria   2. Dysuria   3. Urinary frequency   4. Lower abdominal pain   5. Stage 3 chronic kidney disease, unspecified whether stage 3a or 3b CKD     Start Keflex, urine culture pending. Counseled patient on potential for adverse effects with medications prescribed/recommended today, strict ER and return-to-clinic precautions discussed, patient verbalized understanding.    Jaynee Eagles, [place] 09/24/19 1823

## 2019-09-27 LAB — URINE CULTURE: Culture: >=100000 — AB

## 2019-09-30 NOTE — Congregational Nurse Program (Signed)
Lori Riley was seen at walk-in clinic for b/p check, advice about her Dx of recent UTI and need of emotional support.  Medication adherence, (Cephalexin 500mg  BID) discussed about completing entire dosing and report any side affects to her PCP.

## 2019-12-02 ENCOUNTER — Encounter: Payer: Self-pay | Admitting: *Deleted

## 2019-12-02 NOTE — Congregational Nurse Program (Signed)
  Dept: [facility] Nurse Program Note  Date of Encounter: 12/02/2019  Past Medical History: Past Medical History:  Diagnosis Date  . Anemia   . Gout    rt foot  . HPV (human papilloma virus) infection    negative genotype  . Hypertension   . Lupus ([place])   . Osteoarthritis   . Osteoporosis     Encounter Details:  CNP Questionnaire - 12/02/19 1630      Questionnaire   Patient Status Not Applicable    Race Black or African American    Location Patient Served At [facility]    Uninsured Not Applicable    Food No food insecurities    Housing/Utilities Yes, have permanent housing    Transportation No transportation needs    Interpersonal Safety Yes, feel physically and emotionally safe where you currently live    Medication No medication insecurities    Medical Provider Yes    Referrals Not Applicable    ED Visit Averted Not Applicable    Life-Saving Intervention Made Not Applicable          Client came to [facility] spirit clinic today for screening.  BP 189/89 P 71.  Client says that she is on 2 BP medications and many other medications and that she is aware of her high blood pressure.  She says she was eating potato chips and believes that is why her BP is up.  She tells me that she has a small tumor in her brain which is under treatment.  She also reports a h/o lupus, HTN, RA.  She reports fracturing her right knee cap in 06/21 and has a leg brace/cane for that injury.  She has a follow up appt on 08/29 with her orthopedist.  We discussed keeping her diet low in sodium, getting exercise and eating a healthy diet.  I encouraged her to come to clinic again prn.  Karene Fry, RN, MSN, [place] [facility] [phone] Cell

## 2019-12-23 ENCOUNTER — Encounter: Payer: Self-pay | Admitting: *Deleted

## 2019-12-23 NOTE — Congregational Nurse Program (Signed)
  Dept: [facility] Nurse Program Note  Date of Encounter: 12/23/2019  Past Medical History: Past Medical History:  Diagnosis Date  . Anemia   . Gout    rt foot  . HPV (human papilloma virus) infection    negative genotype  . Hypertension   . Lupus ([place])   . Osteoarthritis   . Osteoporosis     Encounter Details:  CNP Questionnaire - 12/23/19 1700     Questionnaire   Patient Status Not Applicable    Race Black or African American    Location Patient Served At [facility]    Uninsured Not Applicable    Food No food insecurities    Housing/Utilities Yes, have permanent housing    Transportation No transportation needs    Interpersonal Safety Yes, feel physically and emotionally safe where you currently live    Medication No medication insecurities    Medical Provider Yes    Referrals Not Applicable    ED Visit Averted Not Applicable    Life-Saving Intervention Made Not Applicable          Client came into [facility] today.  She brought her medications for review.  She has prednisone, chlorothiazide, allopurinal, amlodipine, and vitamin D.  Pulse ox checked 93 and again checked different finger at 95.  Discussed normal range for pulse ox with client.  Today she has left shoulder pain and some right knee pain.  She had a follow up xray done yesterday of her right knee at her orthopedic provider per client report.  Client has her cane with her today, but is not wearing a knee brace.  Encouraged her to return to clinic for prn checks.  Karene Fry, RN, MSN, [place] [facility] [phone] Cell

## 2020-02-10 ENCOUNTER — Encounter: Payer: Self-pay | Admitting: *Deleted

## 2020-02-10 NOTE — Congregational Nurse Program (Signed)
  Dept: [place] Nurse Program Note  Date of Encounter: 02/10/2020  Past Medical History: Past Medical History:  Diagnosis Date  . Anemia   . Gout    rt foot  . HPV (human papilloma virus) infection    negative genotype  . Hypertension   . Lupus ([place])   . Osteoarthritis   . Osteoporosis     Encounter Details:  CNP Questionnaire - 02/10/20 2302      Questionnaire   Do you give verbal consent to treat you today? Yes    Visit Setting Church or Organization    Location Patient Served At [facility]    Patient Status Not Applicable    Medical Provider Yes    Insurance Private Insurance    Intervention Assess (including screenings)          Client [facility] into [place] [facility] today.  BP 164/97 P 74.  Client reports that her BP is always high and that her primary care MD is aware.  CBG 126 at 6:00 pm today.  Pulse oximetry 99-100%.  Client reports that she likes to check in each month.  Client feels well today and was interactive during session at [facility] today.  Will see client prn.  Karene Fry, RN, MSN, [place] Office [phone] Cell

## 2020-04-21 ENCOUNTER — Emergency Department (HOSPITAL_COMMUNITY)
Admission: EM | Admit: 2020-04-21 | Discharge: 2020-04-22 | Disposition: A | Discharge status: Home / Self Care | Payer: Medicare Other | Attending: Emergency Medicine | Admitting: Emergency Medicine

## 2020-04-21 ENCOUNTER — Emergency Department (HOSPITAL_COMMUNITY): Payer: Medicare Other

## 2020-04-21 ENCOUNTER — Other Ambulatory Visit: Payer: Self-pay

## 2020-04-21 DIAGNOSIS — Z79899 Other long term (current) drug therapy: Secondary | ICD-10-CM | POA: Diagnosis not present

## 2020-04-21 DIAGNOSIS — N179 Acute kidney failure, unspecified: Secondary | ICD-10-CM | POA: Insufficient documentation

## 2020-04-21 DIAGNOSIS — Z91040 Latex allergy status: Secondary | ICD-10-CM | POA: Insufficient documentation

## 2020-04-21 DIAGNOSIS — K449 Diaphragmatic hernia without obstruction or gangrene: Secondary | ICD-10-CM | POA: Diagnosis not present

## 2020-04-21 DIAGNOSIS — E860 Dehydration: Secondary | ICD-10-CM | POA: Diagnosis not present

## 2020-04-21 DIAGNOSIS — I10 Essential (primary) hypertension: Secondary | ICD-10-CM | POA: Diagnosis not present

## 2020-04-21 DIAGNOSIS — Z96643 Presence of artificial hip joint, bilateral: Secondary | ICD-10-CM | POA: Diagnosis not present

## 2020-04-21 DIAGNOSIS — E278 Other specified disorders of adrenal gland: Secondary | ICD-10-CM | POA: Insufficient documentation

## 2020-04-21 DIAGNOSIS — Z20822 Contact with and (suspected) exposure to covid-19: Secondary | ICD-10-CM | POA: Diagnosis not present

## 2020-04-21 DIAGNOSIS — R531 Weakness: Secondary | ICD-10-CM | POA: Diagnosis present

## 2020-04-21 LAB — CBC WITH DIFFERENTIAL/PLATELET
Abs Immature Granulocytes: 0.08 10*3/uL — ABNORMAL HIGH (ref 0.00–0.07)
Basophils Absolute: 0 10*3/uL (ref 0.0–0.1)
Basophils Relative: 1 %
Eosinophils Absolute: 0 10*3/uL (ref 0.0–0.5)
Eosinophils Relative: 1 %
HCT: 42.8 % (ref 36.0–46.0)
Hemoglobin: 13.8 g/dL (ref 12.0–15.0)
Immature Granulocytes: 1 %
Lymphocytes Relative: 25 %
Lymphs Abs: 1.6 10*3/uL (ref 0.7–4.0)
MCH: 29.4 pg (ref 26.0–34.0)
MCHC: 32.2 g/dL (ref 30.0–36.0)
MCV: 91.3 fL (ref 80.0–100.0)
Monocytes Absolute: 0.7 10*3/uL (ref 0.1–1.0)
Monocytes Relative: 11 %
Neutro Abs: 4 10*3/uL (ref 1.7–7.7)
Neutrophils Relative %: 61 %
Platelets: 205 10*3/uL (ref 150–400)
RBC: 4.69 MIL/uL (ref 3.87–5.11)
RDW: 14.6 % (ref 11.5–15.5)
WBC: 6.4 10*3/uL (ref 4.0–10.5)
nRBC: 0 % (ref 0.0–0.2)

## 2020-04-21 LAB — URINALYSIS, ROUTINE W REFLEX MICROSCOPIC
Bilirubin Urine: NEGATIVE
Glucose, UA: NEGATIVE mg/dL
Ketones, ur: 20 mg/dL — AB
Nitrite: NEGATIVE
Protein, ur: 30 mg/dL — AB
Specific Gravity, Urine: 1.016 (ref 1.005–1.030)
pH: 5 (ref 5.0–8.0)

## 2020-04-21 LAB — COMPREHENSIVE METABOLIC PANEL
ALT: 19 U/L (ref 0–44)
AST: 44 U/L — ABNORMAL HIGH (ref 15–41)
Albumin: 3.2 g/dL — ABNORMAL LOW (ref 3.5–5.0)
Alkaline Phosphatase: 45 U/L (ref 38–126)
Anion gap: 18 — ABNORMAL HIGH (ref 5–15)
BUN: 25 mg/dL — ABNORMAL HIGH (ref 8–23)
CO2: 20 mmol/L — ABNORMAL LOW (ref 22–32)
Calcium: 9.3 mg/dL (ref 8.9–10.3)
Chloride: 97 mmol/L — ABNORMAL LOW (ref 98–111)
Creatinine, Ser: 2.14 mg/dL — ABNORMAL HIGH (ref 0.44–1.00)
GFR, Estimated: 25 mL/min — ABNORMAL LOW (ref >60)
Glucose, Bld: 134 mg/dL — ABNORMAL HIGH (ref 70–99)
Potassium: 3.3 mmol/L — ABNORMAL LOW (ref 3.5–5.1)
Sodium: 135 mmol/L (ref 135–145)
Total Bilirubin: 2.8 mg/dL — ABNORMAL HIGH (ref 0.3–1.2)
Total Protein: 7.8 g/dL (ref 6.5–8.1)

## 2020-04-21 LAB — RESP PANEL BY RT-PCR (FLU A&B, COVID) ARPGX2
Influenza A by PCR: NEGATIVE
Influenza B by PCR: NEGATIVE
SARS Coronavirus 2 by RT PCR: NEGATIVE

## 2020-04-21 LAB — PROTIME-INR
INR: 1.1 (ref 0.8–1.2)
Prothrombin Time: 13.5 seconds (ref 11.4–15.2)

## 2020-04-21 LAB — LACTIC ACID, PLASMA
Lactic Acid, Venous: 3.6 mmol/L (ref 0.5–1.9)
Lactic Acid, Venous: 2.7 mmol/L (ref 0.5–1.9)

## 2020-04-21 LAB — APTT: aPTT: 42 seconds — ABNORMAL HIGH (ref 24–36)

## 2020-04-21 MED ORDER — SODIUM CHLORIDE 0.9 % IV BOLUS
1000.0000 mL | Freq: Once | INTRAVENOUS | Status: AC
  Administered 2020-04-21: 1000 mL via INTRAVENOUS

## 2020-04-21 NOTE — ED Notes (Signed)
Wrapped pt in warm blankets to try and get a pulse oximeter reading. Pt is cold to touch.

## 2020-04-21 NOTE — ED Provider Notes (Signed)
[facility] EMERGENCY DEPARTMENT Provider Note   CSN: [id number] Arrival date & time: 04/21/20  1628     History Chief Complaint  Patient presents with  . Weakness  . Hypotension    Lori Riley is a 69 y.o. female.  Patient arrives from home with complaint of fatigue, weakness, nausea onset about one week ago. Symptoms have been gradually worsening. No vomiting, abdominal pain, chest pain, cough, shortness of breath, or fever. Occasional chill.   The history is provided by the patient. No language interpreter was used.  Weakness Severity:  Moderate Duration:  1 week Timing:  Constant Progression:  Worsening Associated symptoms: headaches   Associated symptoms: no abdominal pain, no chest pain, no cough, no fever, no loss of consciousness and no vomiting        Past Medical History:  Diagnosis Date  . Anemia   . Gout    rt foot  . HPV (human papilloma virus) infection    negative genotype  . Hypertension   . Lupus ([place])   . Osteoarthritis   . Osteoporosis     Patient Active Problem List   Diagnosis Date Noted  . Lupus ([place])   . Osteoarthritis   . Gout     Past Surgical History:  Procedure Laterality Date  . CYSTOCELE REPAIR    . TOTAL HIP ARTHROPLASTY  2005   rt & left  . TUBAL LIGATION  1983     OB History    Gravida  3   Para  3   Term  3   Preterm      AB      Living  2     SAB      IAB      Ectopic      Multiple      Live Births  2           Family History  Problem Relation Age of Onset  . Hypertension Mother   . Breast cancer Sister   . Hypertension Sister   . Hypertension Sister   . Stroke Sister   . Hypertension Sister     Social History   Tobacco Use  . Smoking status: Never Smoker  . Smokeless tobacco: Never Used  Substance Use Topics  . Alcohol use: No  . Drug use: No    Home Medications Prior to Admission medications   Medication Sig Start Date End Date Taking? Authorizing  Provider  allopurinol (ZYLOPRIM) 100 MG tablet Take 100 mg by mouth daily.      [provider]  amLODipine (NORVASC) 2.5 MG tablet Take 2.5 mg by mouth daily.    [provider]  cabergoline (DOSTINEX) 0.5 MG tablet Take 0.25 mg by mouth 2 (two) times a week.    [provider]  cephALEXin (KEFLEX) 500 MG capsule Take 1 capsule (500 mg total) by mouth 2 (two) times daily. 09/24/19   Jaynee Eagles, PA-C  chlorthalidone (HYGROTON) 25 MG tablet Take 25 mg by mouth daily.    [provider]  colchicine 0.6 MG tablet Take 2 tabs po now, then 1 tab po one hour later.  Then take 1 tab po daily until joint swelling improved Patient taking differently: Take 0.6 mg by mouth See admin instructions. Take 2 tabs po now, then 1 tab po one hour later.  Then take 1 tab po daily until joint swelling improved 05/03/18   Harrie Foreman, MD  naproxen (NAPROSYN) 500 MG  tablet Take 1 tablet (500 mg total) by mouth 2 (two) times daily. 02/03/18   Wieters, Hallie C, PA-C  predniSONE (DELTASONE) 10 MG tablet Take 10 mg by mouth daily.      [provider]  Vitamin D, Ergocalciferol, (DRISDOL) 50000 UNITS CAPS capsule Take 1 capsule (50,000 Units total) by mouth every 14 (fourteen) days. 03/05/13   Regina Eck, CNM    Allergies    Latex and Sulfa antibiotics  Review of Systems   Review of Systems  Constitutional: Negative for fever.  Respiratory: Negative for cough.   Cardiovascular: Negative for chest pain.  Gastrointestinal: Negative for abdominal pain and vomiting.  Neurological: Positive for weakness and headaches. Negative for loss of consciousness.  All other systems reviewed and are negative.   Physical Exam Updated Vital Signs BP (!) 84/59 (BP Location: Left Arm)   Pulse 85   Temp 98.3 F (36.8 C) (Oral)   Resp 14   LMP 05/01/1997   SpO2 98%   Physical Exam HENT:     Head: Atraumatic.     Mouth/Throat:     Mouth: Mucous membranes are moist.   Eyes:     Conjunctiva/sclera: Conjunctivae normal.  Cardiovascular:     Rate and Rhythm: Normal rate.  Pulmonary:     Effort: Pulmonary effort is normal.     Breath sounds: Normal breath sounds.  Abdominal:     Palpations: Abdomen is soft.     Tenderness: There is no abdominal tenderness.  Musculoskeletal:        General: Normal range of motion.     Right lower leg: No edema.     Left lower leg: No edema.  Skin:    General: Skin is warm and dry.  Neurological:     Mental Status: She is alert and oriented to person, place, and time.  Psychiatric:        Mood and Affect: Mood normal.        Behavior: Behavior normal.     ED Results / Procedures / Treatments   Labs (all labs ordered are listed, but only abnormal results are displayed) Labs Reviewed - No data to display  EKG EKG Interpretation  Date/Time:  Wednesday April 21 2020 17:28:54 EST Ventricular Rate:  93 PR Interval:    QRS Duration: 91 QT Interval:  387 QTC Calculation: 482 R Axis:   -9 Text Interpretation: Sinus rhythm Borderline short PR interval LVH with secondary repolarization abnormality Anterior Q waves, possibly due to LVH No significant change since last tracing Confirmed by Wandra Arthurs [phone]) on 04/21/2020 6:21:34 PM   Radiology DG Chest Port 1 View  Result Date: 04/21/2020 CLINICAL DATA:  69 year old female with concern for sepsis. EXAM: PORTABLE CHEST 1 VIEW COMPARISON:  Chest radiograph dated 06/12/2019. FINDINGS: The lungs are clear. There is no pleural effusion pneumothorax. The cardiac silhouette is within limits. No acute osseous pathology. IMPRESSION: No active disease. Electronically Signed   By: Anner Crete M.D.   On: 04/21/2020 18:06   CT Renal Stone Study  Result Date: 04/21/2020 CLINICAL DATA:  Generalized weakness with fatigue, nausea and loss of appetite. EXAM: CT ABDOMEN AND PELVIS WITHOUT CONTRAST TECHNIQUE: Multidetector CT imaging of the abdomen and pelvis was  performed following the standard protocol without IV contrast. COMPARISON:  Dictation report from the prior abdomen and pelvis CT, dated March 17, 2008. FINDINGS: Lower chest: A 4 mm noncalcified lung nodule is seen within the posterolateral aspect of the right lung base.  This is described in the dictation report from the prior study. Hepatobiliary: No focal liver abnormality is seen. No gallstones, gallbladder wall thickening, or biliary dilatation. Pancreas: Unremarkable. No pancreatic ductal dilatation or surrounding inflammatory changes. Spleen: Normal in size without focal abnormality. Adrenals/Urinary Tract: The right adrenal gland is normal in appearance. A well-demarcated, 1.5 cm x 1.1 cm low-attenuation (approximately 2.38 Hounsfield units) left adrenal mass is seen. Kidneys are normal, without renal calculi, focal lesion, or hydronephrosis. Bladder is unremarkable. Stomach/Bowel: There is a small hiatal hernia. Appendix appears normal. No evidence of bowel wall thickening, distention, or inflammatory changes. Vascular/Lymphatic: No significant vascular findings are present. No enlarged abdominal or pelvic lymph nodes. Reproductive: Uterus and bilateral adnexa are unremarkable. Other: No abdominal wall hernia or abnormality. No abdominopelvic ascites. Musculoskeletal: Bilateral total hip replacements are seen. An extensive amount of associated streak artifact is noted with subsequently limited evaluation of the adjacent osseous and soft tissue structures. A approximately 3 mm anterolisthesis of the L4 vertebral body is seen on L5. Degenerative changes are also seen at this level. IMPRESSION: 1. Benign-appearing low-attenuation left adrenal mass which likely represents an adrenal adenoma. 2. Small hiatal hernia. 3. Bilateral total hip replacements. Electronically Signed   By: Virgina Norfolk M.D.   On: 04/21/2020 19:30    Procedures Procedures (including critical care time)  Medications Ordered in  ED Medications  sodium chloride 0.9 % bolus 1,000 mL (0 mLs Intravenous Stopped 04/21/20 2206)  sodium chloride 0.9 % bolus 1,000 mL (1,000 mLs Intravenous New Bag/Given 04/21/20 2303)    ED Course  I have reviewed the triage vital signs and the nursing notes.  Pertinent labs & imaging results that were available during my care of the patient were reviewed by me and considered in my medical decision making (see chart for details).    MDM Rules/Calculators/A&P                          Patient discussed with and seen by Dr. Darl Householder. No indication of sepsis. Lactate and mild AKI likely due to dehydration. Fluid bolus given in ED with improvement in lactate. Patient normotensive with exception of initial blood pressure. Recommend not taking NSAID and colchicine. Follow-up with primary care in one week for repeat renal function labs. Care instructions and return precautions provided. Patient appears safe for discharge at this time. Final Clinical Impression(s) / ED Diagnoses Final diagnoses:  Dehydration    Rx / DC Orders ED Discharge Orders    None       Etta Quill, NP 04/21/20 2349    Drenda Freeze, MD 04/23/20 1304

## 2020-04-21 NOTE — ED Triage Notes (Signed)
Pt c/o weakness, fatigue, nausea and loss of appetite. Denies pain.

## 2020-04-21 NOTE — ED Notes (Addendum)
Pt reports the following s/s since Saturday: nausea, weakness, headache, "feeling cold" and loss of appetite.

## 2020-04-21 NOTE — ED Notes (Signed)
Pt's ear is finally warm enough to pick up the pulse oximeter

## 2020-04-21 NOTE — ED Notes (Signed)
Per Darl Householder, MD collect lactic acid after bolus

## 2020-04-21 NOTE — Discharge Instructions (Signed)
Recheck renal function in one week. Avoid naproxen and colchicine for now.

## 2020-04-21 NOTE — ED Notes (Signed)
Lactic Acid 3.6, notified NP

## 2020-04-21 NOTE — ED Notes (Signed)
Pt transported to CT at this time.

## 2020-04-24 LAB — URINE CULTURE: Culture: >=100000 — AB

## 2020-04-25 NOTE — Progress Notes (Signed)
ED Antimicrobial Stewardship Positive Culture Follow Up   Lori Riley is an 69 y.o. female who presented to [facility] on 04/21/2020 with a chief complaint of fatigue, weakness, nausea x 1 week.  Chief Complaint  Patient presents with  . Weakness  . Hypotension    Recent Results (from the past 720 hour(s))  Blood culture (routine single)     Status: None (Preliminary result)   Collection Time: 04/21/20  5:40 PM   Specimen: BLOOD  Result Value Ref Range Status   Specimen Description BLOOD RIGHT ANTECUBITAL  Final   Special Requests   Final    BOTTLES DRAWN AEROBIC AND ANAEROBIC Blood Culture adequate volume   Culture   Final    NO GROWTH 3 DAYS Performed at [facility], [address] [place], [place] [address]    Report Status PENDING  Incomplete  Resp Panel by RT-PCR (Flu A&B, Covid) Nasopharyngeal Swab     Status: None   Collection Time: 04/21/20  7:58 PM   Specimen: Nasopharyngeal Swab; Nasopharyngeal(NP) swabs in vial transport medium  Result Value Ref Range Status   SARS Coronavirus 2 by RT PCR NEGATIVE NEGATIVE Final    Comment: (NOTE) SARS-CoV-2 target nucleic acids are NOT DETECTED.  The SARS-CoV-2 RNA is generally detectable in upper respiratory specimens during the acute phase of infection. The lowest concentration of SARS-CoV-2 viral copies this assay can detect is 138 copies/mL. A negative result does not preclude SARS-Cov-2 infection and should not be used as the sole basis for treatment or other patient management decisions. A negative result may occur with  improper specimen collection/handling, submission of specimen other than nasopharyngeal swab, presence of viral mutation(s) within the areas targeted by this assay, and inadequate number of viral copies(<138 copies/mL). A negative result must be combined with clinical observations, patient history, and epidemiological information. The expected result is Negative.  Fact Sheet for Patients:   EntrepreneurPulse.com.au  Fact Sheet for Healthcare Providers:  IncredibleEmployment.be  This test is no t yet approved or cleared by the [place] FDA and  has been authorized for detection and/or diagnosis of SARS-CoV-2 by FDA under an Emergency Use Authorization (EUA). This EUA will remain  in effect (meaning this test can be used) for the duration of the COVID-19 declaration under Section 564(b)(1) of the Act, 21 U.S.C.section 360bbb-3(b)(1), unless the authorization is terminated  or revoked sooner.       Influenza A by PCR NEGATIVE NEGATIVE Final   Influenza B by PCR NEGATIVE NEGATIVE Final    Comment: (NOTE) The Xpert Xpress SARS-CoV-2/FLU/RSV plus assay is intended as an aid in the diagnosis of influenza from Nasopharyngeal swab specimens and should not be used as a sole basis for treatment. Nasal washings and aspirates are unacceptable for Xpert Xpress SARS-CoV-2/FLU/RSV testing.  Fact Sheet for Patients: EntrepreneurPulse.com.au  Fact Sheet for Healthcare Providers: IncredibleEmployment.be  This test is not yet approved or cleared by the [place] FDA and has been authorized for detection and/or diagnosis of SARS-CoV-2 by FDA under an Emergency Use Authorization (EUA). This EUA will remain in effect (meaning this test can be used) for the duration of the COVID-19 declaration under Section 564(b)(1) of the Act, 21 U.S.C. section 360bbb-3(b)(1), unless the authorization is terminated or revoked.  Performed at [facility], [place] [address] [place], [place] [address]   Urine culture     Status: Abnormal   Collection Time: 04/21/20 10:35 PM   Specimen: In/Out Cath Urine  Result Value Ref Range Status   Specimen Description IN/OUT CATH URINE  Final   Special Requests   Final    NONE Performed at [facility], [place] [address] [place], [place] [address]    Culture >=100,000  COLONIES/mL ESCHERICHIA COLI (A)  Final   Report Status 04/24/2020 FINAL  Final   Organism ID, Bacteria ESCHERICHIA COLI (A)  Final      Susceptibility   Escherichia coli - MIC*    AMPICILLIN <=2 SENSITIVE Sensitive     CEFAZOLIN <=4 SENSITIVE Sensitive     CEFEPIME <=0.12 SENSITIVE Sensitive     CEFTRIAXONE <=0.25 SENSITIVE Sensitive     CIPROFLOXACIN <=0.25 SENSITIVE Sensitive     GENTAMICIN <=1 SENSITIVE Sensitive     IMIPENEM <=0.25 SENSITIVE Sensitive     NITROFURANTOIN <=16 SENSITIVE Sensitive     TRIMETH/SULFA <=20 SENSITIVE Sensitive     AMPICILLIN/SULBACTAM <=2 SENSITIVE Sensitive     PIP/TAZO <=4 SENSITIVE Sensitive     * >=100,000 COLONIES/mL ESCHERICHIA COLI    Patient placement to call patient for symptom check. If symptoms have improved or resolved, no antibiotics necessary. If symptoms persist or have worsened, start:  Cephalexin 500 mg twice daily x 5 days    ED Provider: Silverio Decamp, PA-C    Albertina Parr, PharmD., BCPS, BCCCP Clinical Pharmacist Please refer to [facility] for unit-specific pharmacist

## 2020-04-26 LAB — CULTURE, BLOOD (SINGLE)
Culture: NO GROWTH
Special Requests: ADEQUATE

## 2020-05-18 ENCOUNTER — Ambulatory Visit: Payer: Self-pay | Admitting: Podiatry

## 2020-05-25 ENCOUNTER — Other Ambulatory Visit: Payer: Self-pay

## 2020-05-25 ENCOUNTER — Ambulatory Visit: Payer: Medicare Other | Admitting: Podiatry

## 2020-05-25 DIAGNOSIS — M79674 Pain in right toe(s): Secondary | ICD-10-CM

## 2020-05-25 DIAGNOSIS — M79675 Pain in left toe(s): Secondary | ICD-10-CM

## 2020-05-25 DIAGNOSIS — K5901 Slow transit constipation: Secondary | ICD-10-CM | POA: Insufficient documentation

## 2020-05-25 DIAGNOSIS — B351 Tinea unguium: Secondary | ICD-10-CM | POA: Diagnosis not present

## 2020-05-25 DIAGNOSIS — K219 Gastro-esophageal reflux disease without esophagitis: Secondary | ICD-10-CM | POA: Insufficient documentation

## 2020-05-25 DIAGNOSIS — Z1211 Encounter for screening for malignant neoplasm of colon: Secondary | ICD-10-CM | POA: Insufficient documentation

## 2020-05-30 NOTE — Progress Notes (Signed)
Subjective:   Patient ID: Lori Riley, female   DOB: 70 y.o.   MRN: [id number]   HPI 70 year old female presents the office with concerns of thickened discolored toenails that she cannot trim her self and are causing discomfort.  She denies any redness or drainage or any swelling to the toenail sites.  She denies any ulcerations.  She has no other concerns today.  Review of Systems  All other systems reviewed and are negative.  Past Medical History:  Diagnosis Date  . Anemia   . Gout    rt foot  . HPV (human papilloma virus) infection    negative genotype  . Hypertension   . Lupus ([place])   . Osteoarthritis   . Osteoporosis     Past Surgical History:  Procedure Laterality Date  . CYSTOCELE REPAIR    . TOTAL HIP ARTHROPLASTY  2005   rt & left  . TUBAL LIGATION  1983     Current Outpatient Medications:  .  allopurinol (ZYLOPRIM) 100 MG tablet, Take 100 mg by mouth daily.  , Disp: , Rfl:  .  amLODipine (NORVASC) 2.5 MG tablet, Take 2.5 mg by mouth daily., Disp: , Rfl:  .  cabergoline (DOSTINEX) 0.5 MG tablet, Take 0.25 mg by mouth 2 (two) times a week., Disp: , Rfl:  .  chlorthalidone (HYGROTON) 25 MG tablet, Take 25 mg by mouth daily., Disp: , Rfl:  .  colchicine 0.6 MG tablet, Take 2 tabs po now, then 1 tab po one hour later.  Then take 1 tab po daily until joint swelling improved (Patient taking differently: Take 0.6 mg by mouth See admin instructions. Take 2 tabs po now, then 1 tab po one hour later.  Then take 1 tab po daily until joint swelling improved), Disp: 15 tablet, Rfl: 1 .  naproxen (NAPROSYN) 500 MG tablet, Take 1 tablet (500 mg total) by mouth 2 (two) times daily., Disp: 30 tablet, Rfl: 0 .  predniSONE (DELTASONE) 10 MG tablet, Take 10 mg by mouth daily.  , Disp: , Rfl:  .  Vitamin D, Ergocalciferol, (DRISDOL) 50000 UNITS CAPS capsule, Take 1 capsule (50,000 Units total) by mouth every 14 (fourteen) days., Disp: 30 capsule, Rfl: 12  Allergies  Allergen  Reactions  . Latex Itching  . Sulfa Antibiotics Itching    Other reaction(s): Unknown Other reaction(s): Other Shiver and chills         Objective:  Physical Exam  General: AAO x3, NAD  Dermatological: Nails are hypertrophic, dystrophic, brittle, discolored, elongated 10. No surrounding redness or drainage. Tenderness nails 1-5 bilaterally. No open lesions or pre-ulcerative lesions are identified today.  Vascular: Dorsalis Pedis artery and Posterior Tibial artery pedal pulses are 2/4 bilateral with immedate capillary fill time. There is no pain with calf compression, swelling, warmth, erythema.   Neruologic: Grossly intact via light touch bilateral.   Musculoskeletal:  No pain, crepitus, or limitation noted with foot and ankle range of motion bilateral. Muscular strength 5/5 in all groups tested bilateral.     Assessment:   Symptomatic onychomycosis     Plan:  -Treatment options discussed including all alternatives, risks, and complications -Etiology of symptoms were discussed -Nails debrided 10 without complications or bleeding. -Daily foot inspection -Follow-up in 3 months or sooner if any problems arise. In the meantime, encouraged to call the office with any questions, concerns, change in symptoms.   Celesta Gentile, DPM

## 2020-06-15 ENCOUNTER — Encounter: Payer: Self-pay | Admitting: *Deleted

## 2020-06-18 NOTE — Congregational Nurse Program (Signed)
  Dept: [place] Nurse Program Note  Date of Encounter: 06/15/2020  Past Medical History: Past Medical History:  Diagnosis Date  . Anemia   . Gout    rt foot  . HPV (human papilloma virus) infection    negative genotype  . Hypertension   . Lupus ([place])   . Osteoarthritis   . Osteoporosis     Encounter Details:  CNP Questionnaire - 06/15/20 1640     Questionnaire   Do you give verbal consent to treat you today? Yes    Visit Setting Church or Organization    Location Patient Served At [facility]    Patient Status Not Applicable    Medical Provider Yes    Insurance Private Insurance    Intervention Assess (including screenings);Counsel;Educate    ED Visit Averted Yes          [facility] and CBG check.  BP 196/103 HR 69 and CBG 87.  Client reports that she has not taken her BP medications yet today.  Reminded client to take medications every day at the same time if possible.  Client reports that she has been to PCP recently.  Will continue to monitor BP when client comes to clinic.  Client reports feeling well today with no symptoms related to elevated BP.  Karene Fry, RN, MSN, [place] Office [phone] Cell

## 2020-09-06 ENCOUNTER — Ambulatory Visit: Payer: Medicare Other | Admitting: Podiatry

## 2021-01-04 ENCOUNTER — Encounter: Payer: Self-pay | Admitting: *Deleted

## 2021-01-05 NOTE — Congregational Nurse Program (Signed)
  Dept: [place] Nurse Program Note  Date of Encounter: 01/04/2021  Past Medical History: Past Medical History:  Diagnosis Date   Anemia    Gout    rt foot   HPV (human papilloma virus) infection    negative genotype   Hypertension    Lupus ([place])    Osteoarthritis    Osteoporosis     Encounter Details:  CNP Questionnaire - 01/05/21 1654       Questionnaire   Do you give verbal consent to treat you today? Yes    Visit Setting Church or Organization    Location Patient Served At [facility]    Patient Status Not Applicable    Medical Provider Yes    Insurance Private Insurance    Intervention Assess (including screenings)    ED Visit Averted Yes            Client came into [facility] clinic.  BP 152/92 HR 75 and CBG 108.  Client tells me that she has not taken her BP medication today yet.  Also client has a h/o Diabetes in her family so she likes to check her sugar monthly.  Client says that she feels a little tired today but otherwise is okay.  Client to follow up with this CN as needed.  Karene Fry, RN, MSN, [place] Office [phone] Cell

## 2021-07-05 ENCOUNTER — Encounter: Payer: Self-pay | Admitting: *Deleted

## 2021-07-05 DIAGNOSIS — R7309 Other abnormal glucose: Secondary | ICD-10-CM

## 2021-07-06 LAB — GLUCOSE, POCT (MANUAL RESULT ENTRY): POC Glucose: 118 mg/dl — AB (ref 70–99)

## 2021-07-06 NOTE — Congregational Nurse Program (Signed)
?  Dept: [phone] ? ? ?Congregational Nurse Program Note ? ?Date of Encounter: 07/05/2021 ? ?Past Medical History: ?Past Medical History:  ?Diagnosis Date  ? Anemia   ? Gout   ? rt foot  ? HPV (human papilloma virus) infection   ? negative genotype  ? Hypertension   ? Lupus ([place])   ? Osteoarthritis   ? Osteoporosis   ? ? ?Encounter Details: ? CNP Questionnaire - 07/05/21 1700   ? ?  ? Questionnaire  ? Do you give verbal consent to treat you today? Yes   ? Location Patient [place]   ? Visit Setting Church or Organization   ? Patient Status Unknown   ? Sport and exercise psychologist or [employer]   ? Insurance Referral N/A   ? Medication N/A   ? Medical Provider Yes   ? Medical Referral N/A   ? Medical Appointment Made N/A   ? Food Have Food Insecurities   ? Transportation N/A   ? Housing/Utilities N/A   ? Interpersonal Safety N/A   ? Intervention Blood pressure;Blood glucose;Educate   ? ED Visit Averted Yes   ? Life-Saving Intervention Made N/A   ? ?  ?  ? ?  ? ?Client came into [facility] for Bible study and nurse clinic.  BP is elevated.  Client tells this CN that a different medication was prescribed.  She is taking the new medications.  Will monitor BP when client comes into clinic over the next couple weeks. ? ?Karene Fry, RN, MSN, CNP ?[phone] Office ?[phone] Cell ? ? ? ?

## 2022-02-21 ENCOUNTER — Ambulatory Visit: Payer: Medicare Other

## 2022-02-21 DIAGNOSIS — Z23 Encounter for immunization: Secondary | ICD-10-CM

## 2022-05-02 ENCOUNTER — Encounter: Payer: Self-pay | Admitting: *Deleted

## 2022-05-02 DIAGNOSIS — R7309 Other abnormal glucose: Secondary | ICD-10-CM

## 2022-05-02 LAB — GLUCOSE, POCT (MANUAL RESULT ENTRY): POC Glucose: 92 mg/dl (ref 70–99)

## 2022-05-02 NOTE — Congregational Nurse Program (Signed)
  Dept: [phone]   Congregational Nurse Program Note  Date of Encounter: 05/02/2022  Past Medical History: Past Medical History:  Diagnosis Date   Anemia    Gout    rt foot   HPV (human papilloma virus) infection    negative genotype   Hypertension    Lupus ([place])    Osteoarthritis    Osteoporosis     Encounter Details:  CNP Questionnaire - 05/02/22 1800       Questionnaire   Ask client: Do you give verbal consent for me to treat you today? Yes    Student Assistance N/A    Location Patient [place]    Visit Setting with Client [facility]    Patient Status Unknown    Insurance Medicare    Insurance/Financial Assistance Referral N/A    Medication N/A    Medical Provider Yes    Screening Referrals Made N/A    Medical Referrals Made N/A    Medical Appointment Made N/A    Recently w/o PCP, now 1st time PCP visit completed due to CNs referral or appointment made N/A    Food Have Food Insecurities    Transportation N/A    Housing/Utilities N/A    Interpersonal Safety N/A    Interventions Educate;Counsel    Abnormal to Normal Screening Since Last CN Visit N/A    Screenings CN Performed Blood Pressure;Blood Glucose    Sent Client to Lab for: N/A    Did client attend any of the following based off CNs referral or appointments made? N/A    ED Visit Averted Yes    Life-Saving Intervention Made N/A            [facility] into [facility] for dinner and Bible study.  Client requested Glucose and BP check.  Both completed.  Discussed BP elevation with client and encouraged client to check in with PCP and be sure she is taking BP medication daily as directed.  Client runs high BP frequently and reports she has been hypertensive for a long time.  Will follow up with client as requested.  Karene Fry, RN, MSN, [place] Office [phone] Cell

## 2022-07-18 ENCOUNTER — Encounter: Payer: Self-pay | Admitting: *Deleted

## 2022-07-18 DIAGNOSIS — R7309 Other abnormal glucose: Secondary | ICD-10-CM

## 2022-07-19 LAB — GLUCOSE, POCT (MANUAL RESULT ENTRY): POC Glucose: 161 mg/dl — AB (ref 70–99)

## 2022-07-19 NOTE — Congregational Nurse Program (Signed)
  Dept: [phone]   Congregational Nurse Program Note  Date of Encounter: 07/18/2022  Past Medical History: Past Medical History:  Diagnosis Date   Anemia    Gout    rt foot   HPV (human papilloma virus) infection    negative genotype   Hypertension    Lupus ([place])    Osteoarthritis    Osteoporosis     Encounter Details:  CNP Questionnaire - 07/18/22 1830       Questionnaire   Ask client: Do you give verbal consent for me to treat you today? Yes    Student Assistance N/A    Location Patient [place]    Visit Setting with Client [employer]    Patient Status Unknown    Insurance Medicare    Insurance/Financial Assistance Referral N/A    Medication N/A    Medical Provider Yes    Screening Referrals Made N/A    Medical Referrals Made N/A    Medical Appointment Made N/A    Recently w/o PCP, now 1st time PCP visit completed due to CNs referral or appointment made N/A    Food Have Food Insecurities    Transportation N/A    Housing/Utilities N/A    Interpersonal Safety N/A    Interventions Educate;Counsel    Abnormal to Normal Screening Since Last CN Visit N/A    Screenings CN Performed Blood Pressure;Blood Glucose;Pulse Ox    Sent Client to Lab for: N/A    Did client attend any of the following based off CNs referral or appointments made? N/A    ED Visit Averted Yes    Life-Saving Intervention Made N/A            Client came into [facility] nurse clinic for check.  BP 142/69 HR 74; O2 sat 99; Glucose finger stick 161 (after eating dinner at 5:30 pm).  Counseled client on elevated glucose.  Will check client glucose in the next couple weeks before dinner.  Will follow up with client as desired.  Karene Fry, RN, MSN, [place] Office [phone] Cell

## 2023-01-10 ENCOUNTER — Encounter: Payer: Self-pay | Admitting: *Deleted

## 2023-01-10 NOTE — Congregational Nurse Program (Signed)
  Dept: [phone]   Congregational Nurse Program Note  Date of Encounter: 01/10/2023  Past Medical History: Past Medical History:  Diagnosis Date   Anemia    Gout    rt foot   HPV (human papilloma virus) infection    negative genotype   Hypertension    Lupus ([facility])    Osteoarthritis    Osteoporosis     Encounter Details:  CNP Questionnaire - 01/09/23 1715       Questionnaire   Ask client: Do you give verbal consent for me to treat you today? Yes    Student Assistance N/A    Location Patient Served  [employer]    Visit Setting with Client [employer]    Patient Status Unknown    Insurance Medicare    Insurance/Financial Assistance Referral N/A    Medication N/A    Medical Provider Yes    Screening Referrals Made N/A    Medical Referrals Made N/A    Medical Appointment Made N/A    Recently w/o PCP, now 1st time PCP visit completed due to CNs referral or appointment made N/A    Food Have Food Insecurities    Transportation N/A    Housing/Utilities N/A    Interpersonal Safety N/A    Interventions Educate;Counsel    Abnormal to Normal Screening Since Last CN Visit N/A    Screenings CN Performed Blood Pressure;Blood Glucose    Sent Client to Lab for: N/A    Did client attend any of the following based off CNs referral or appointments made? N/A    ED Visit Averted Yes    Life-Saving Intervention Made N/A            Client came into [facility] nurse clinic for BP check.  BP 179/76 HR 75 and fingerstick glucose 95.  Client is feeling well today and offers no complaints.  Client will follow up with this CN as desired.  Roderic Palau, RN, MSN, CNP [phone] Office [phone] Cell

## 2023-02-14 ENCOUNTER — Ambulatory Visit: Payer: 59

## 2023-02-14 DIAGNOSIS — Z23 Encounter for immunization: Secondary | ICD-10-CM

## 2023-03-23 ENCOUNTER — Encounter: Payer: Self-pay | Admitting: Podiatry

## 2023-03-23 ENCOUNTER — Ambulatory Visit (INDEPENDENT_AMBULATORY_CARE_PROVIDER_SITE_OTHER): Payer: 59 | Admitting: Podiatry

## 2023-03-23 DIAGNOSIS — M79675 Pain in left toe(s): Secondary | ICD-10-CM | POA: Diagnosis not present

## 2023-03-23 DIAGNOSIS — M79674 Pain in right toe(s): Secondary | ICD-10-CM

## 2023-03-23 DIAGNOSIS — B351 Tinea unguium: Secondary | ICD-10-CM | POA: Diagnosis not present

## 2023-03-23 NOTE — Progress Notes (Unsigned)
Subjective: Chief Complaint  Patient presents with   RFC     RM#11 RFC Patient has no other concerns at this time.   72 year old female presents the office today for concerns of thick elongated nails that she is not able to trim her self.  No swelling redness or drainage.  No open lesions that she reports.  No other concerns.  She has a history of gout and on allopurinol.  She was having some left foot pain that has resolved and she thought she was having gout.  Objective: AAO x3, NAD DP/PT pulses palpable bilaterally, CRT less than 3 seconds Nails are hypertrophic, dystrophic, brittle, discolored, elongated 10. No surrounding redness or drainage. Tenderness nails 1-5 bilaterally. No open lesions or pre-ulcerative lesions are identified today. Very minimal hyperkeratotic tissue metatarsal left foot.  No ulcerations. Unable to appreciate any area pinpoint tenderness or any tenderness bilaterally other than the nails.  There is no edema, erythema or evidence of gout at this time. No pain with calf compression, swelling, warmth, erythema  Assessment: Symptomatic onychomycosis  Plan: -All treatment options discussed with the patient including all alternatives, risks, complications.  -Sharply debrided the toenails x 10 without any complications or bleeding. -As a courtesy I debrided the callus on the left foot as it was very minimal.  Moisturizer, offloading. -Daily foot inspection -No evidence of gout at this time.  If symptoms were to recur let me know. -Patient encouraged to call the office with any questions, concerns, change in symptoms.   Return in about 3 months (around 06/23/2023).  Vivi Barrack DPM

## 2023-04-03 DIAGNOSIS — H25812 Combined forms of age-related cataract, left eye: Secondary | ICD-10-CM | POA: Insufficient documentation

## 2023-06-25 ENCOUNTER — Encounter: Payer: Self-pay | Admitting: Podiatry

## 2023-06-25 ENCOUNTER — Ambulatory Visit (INDEPENDENT_AMBULATORY_CARE_PROVIDER_SITE_OTHER): Payer: 59 | Admitting: Podiatry

## 2023-06-25 DIAGNOSIS — B351 Tinea unguium: Secondary | ICD-10-CM | POA: Insufficient documentation

## 2023-06-25 DIAGNOSIS — M79674 Pain in right toe(s): Secondary | ICD-10-CM

## 2023-06-25 DIAGNOSIS — M79675 Pain in left toe(s): Secondary | ICD-10-CM | POA: Diagnosis not present

## 2023-06-25 NOTE — Progress Notes (Signed)

## 2023-09-26 ENCOUNTER — Ambulatory Visit: Payer: 59 | Admitting: Podiatry

## 2023-10-01 ENCOUNTER — Ambulatory Visit (INDEPENDENT_AMBULATORY_CARE_PROVIDER_SITE_OTHER): Admitting: Podiatry

## 2023-10-01 ENCOUNTER — Encounter: Payer: Self-pay | Admitting: Podiatry

## 2023-10-01 DIAGNOSIS — M79674 Pain in right toe(s): Secondary | ICD-10-CM | POA: Diagnosis not present

## 2023-10-01 DIAGNOSIS — M79675 Pain in left toe(s): Secondary | ICD-10-CM

## 2023-10-01 DIAGNOSIS — B351 Tinea unguium: Secondary | ICD-10-CM

## 2023-10-01 NOTE — Progress Notes (Signed)

## 2023-10-11 DIAGNOSIS — H25811 Combined forms of age-related cataract, right eye: Secondary | ICD-10-CM | POA: Insufficient documentation

## 2024-01-01 ENCOUNTER — Ambulatory Visit (INDEPENDENT_AMBULATORY_CARE_PROVIDER_SITE_OTHER): Admitting: Podiatry

## 2024-01-01 ENCOUNTER — Encounter: Payer: Self-pay | Admitting: Podiatry

## 2024-01-01 DIAGNOSIS — B351 Tinea unguium: Secondary | ICD-10-CM | POA: Diagnosis not present

## 2024-01-01 DIAGNOSIS — M79674 Pain in right toe(s): Secondary | ICD-10-CM | POA: Diagnosis not present

## 2024-01-01 DIAGNOSIS — M79675 Pain in left toe(s): Secondary | ICD-10-CM

## 2024-01-01 NOTE — Progress Notes (Signed)

## 2024-03-19 ENCOUNTER — Encounter (HOSPITAL_COMMUNITY): Payer: Self-pay

## 2024-03-19 ENCOUNTER — Emergency Department (HOSPITAL_COMMUNITY)
Admission: EM | Admit: 2024-03-19 | Discharge: 2024-03-19 | Disposition: A | Discharge status: Home / Self Care | Attending: Emergency Medicine | Admitting: Emergency Medicine

## 2024-03-19 ENCOUNTER — Emergency Department (HOSPITAL_COMMUNITY)

## 2024-03-19 DIAGNOSIS — M24541 Contracture, right hand: Secondary | ICD-10-CM | POA: Diagnosis not present

## 2024-03-19 DIAGNOSIS — R7989 Other specified abnormal findings of blood chemistry: Secondary | ICD-10-CM | POA: Insufficient documentation

## 2024-03-19 DIAGNOSIS — Z79899 Other long term (current) drug therapy: Secondary | ICD-10-CM | POA: Diagnosis not present

## 2024-03-19 DIAGNOSIS — N3000 Acute cystitis without hematuria: Secondary | ICD-10-CM | POA: Diagnosis not present

## 2024-03-19 DIAGNOSIS — M24542 Contracture, left hand: Secondary | ICD-10-CM | POA: Diagnosis not present

## 2024-03-19 DIAGNOSIS — N189 Chronic kidney disease, unspecified: Secondary | ICD-10-CM | POA: Diagnosis not present

## 2024-03-19 DIAGNOSIS — I129 Hypertensive chronic kidney disease with stage 1 through stage 4 chronic kidney disease, or unspecified chronic kidney disease: Secondary | ICD-10-CM | POA: Insufficient documentation

## 2024-03-19 DIAGNOSIS — R1110 Vomiting, unspecified: Secondary | ICD-10-CM | POA: Diagnosis present

## 2024-03-19 DIAGNOSIS — R531 Weakness: Secondary | ICD-10-CM | POA: Diagnosis not present

## 2024-03-19 LAB — CBC WITH DIFFERENTIAL/PLATELET
Abs Immature Granulocytes: 0.16 K/uL — ABNORMAL HIGH (ref 0.00–0.07)
Basophils Absolute: 0 K/uL (ref 0.0–0.1)
Basophils Relative: 0 %
Eosinophils Absolute: 0 K/uL (ref 0.0–0.5)
Eosinophils Relative: 0 %
HCT: 36.8 % (ref 36.0–46.0)
Hemoglobin: 12.4 g/dL (ref 12.0–15.0)
Immature Granulocytes: 2 %
Lymphocytes Relative: 9 %
Lymphs Abs: 0.9 K/uL (ref 0.7–4.0)
MCH: 30 pg (ref 26.0–34.0)
MCHC: 33.7 g/dL (ref 30.0–36.0)
MCV: 89.1 fL (ref 80.0–100.0)
Monocytes Absolute: 0.6 K/uL (ref 0.1–1.0)
Monocytes Relative: 6 %
Neutro Abs: 8.4 K/uL — ABNORMAL HIGH (ref 1.7–7.7)
Neutrophils Relative %: 83 %
Platelets: 185 K/uL (ref 150–400)
RBC: 4.13 MIL/uL (ref 3.87–5.11)
RDW: 15.1 % (ref 11.5–15.5)
WBC: 10.1 K/uL (ref 4.0–10.5)
nRBC: 0 % (ref 0.0–0.2)

## 2024-03-19 LAB — COMPREHENSIVE METABOLIC PANEL WITH GFR
ALT: 19 U/L (ref 0–44)
AST: 36 U/L (ref 15–41)
Albumin: 3.7 g/dL (ref 3.5–5.0)
Alkaline Phosphatase: 68 U/L (ref 38–126)
Anion gap: 18 — ABNORMAL HIGH (ref 5–15)
BUN: 58 mg/dL — ABNORMAL HIGH (ref 8–23)
CO2: 21 mmol/L — ABNORMAL LOW (ref 22–32)
Calcium: 9.2 mg/dL (ref 8.9–10.3)
Chloride: 99 mmol/L (ref 98–111)
Creatinine, Ser: 2.43 mg/dL — ABNORMAL HIGH (ref 0.44–1.00)
GFR, Estimated: 21 mL/min — ABNORMAL LOW (ref >60)
Glucose, Bld: 116 mg/dL — ABNORMAL HIGH (ref 70–99)
Potassium: 3.9 mmol/L (ref 3.5–5.1)
Sodium: 137 mmol/L (ref 135–145)
Total Bilirubin: 1.3 mg/dL — ABNORMAL HIGH (ref 0.0–1.2)
Total Protein: 7.6 g/dL (ref 6.5–8.1)

## 2024-03-19 LAB — URINALYSIS, W/ REFLEX TO CULTURE (INFECTION SUSPECTED)
Bacteria, UA: NONE SEEN
Bilirubin Urine: NEGATIVE
Glucose, UA: NEGATIVE mg/dL
Ketones, ur: 5 mg/dL — AB
Nitrite: NEGATIVE
Protein, ur: NEGATIVE mg/dL
Specific Gravity, Urine: 1.011 (ref 1.005–1.030)
WBC, UA: >50 WBC/hpf (ref 0–5)
pH: 5 (ref 5.0–8.0)

## 2024-03-19 LAB — TROPONIN T, HIGH SENSITIVITY
Troponin T High Sensitivity: 52 ng/L — ABNORMAL HIGH (ref 0–19)
Troponin T High Sensitivity: 45 ng/L — ABNORMAL HIGH (ref 0–19)

## 2024-03-19 MED ORDER — CEPHALEXIN 500 MG PO CAPS
500.0000 mg | ORAL_CAPSULE | Freq: Three times a day (TID) | ORAL | 0 refills | Status: AC
Start: 2024-03-19 — End: ?

## 2024-03-19 MED ORDER — LACTATED RINGERS IV BOLUS
1000.0000 mL | Freq: Once | INTRAVENOUS | Status: AC
  Administered 2024-03-19: 1000 mL via INTRAVENOUS

## 2024-03-19 MED ORDER — SODIUM CHLORIDE 0.9 % IV SOLN
1.0000 g | Freq: Once | INTRAVENOUS | Status: AC
  Administered 2024-03-19: 1 g via INTRAVENOUS
  Filled 2024-03-19: qty 10

## 2024-03-19 NOTE — ED Provider Notes (Signed)
  EMERGENCY DEPARTMENT AT [facility] Provider Note  CSN: [id number] Arrival date & time: 03/19/24 1111  Chief Complaint(s) Weakness  HPI Lori Riley is a 73 y.o. female who is here today for generalized weakness, lack of appetite.  Patient reports that she ate at [employer] 1 week ago, and since then she has not been feeling well.  She reports that she had a few episodes of vomiting, has been having some diarrhea.  She reports that she has been staying in bed the last few days.  She lives at home with her niece, sister.  Extended family helps patient take care of herself and other family members.  She ambulates with a walker.   Past Medical History Past Medical History:  Diagnosis Date   Anemia    Gout    rt foot   HPV (human papilloma virus) infection    negative genotype   Hypertension    Lupus    Osteoarthritis    Osteoporosis    Patient Active Problem List   Diagnosis Date Noted   Combined forms of age-related cataract of right eye 10/11/2023   Pain due to onychomycosis of toenails of both feet 06/25/2023   Combined forms of age-related cataract of left eye 04/03/2023   Colon cancer screening 05/25/2020   Slow transit constipation 05/25/2020   Gastro-esophageal reflux disease without esophagitis 05/25/2020   Lupus    Osteoarthritis    Gout    Home Medication(s) Prior to Admission medications   Medication Sig Start Date End Date Taking? Authorizing Provider  allopurinol  (ZYLOPRIM ) 100 MG tablet Take 100 mg by mouth daily.      [provider]  amLODipine  (NORVASC ) 2.5 MG tablet Take 2.5 mg by mouth daily.    [provider]  cabergoline (DOSTINEX) 0.5 MG tablet Take 0.25 mg by mouth 2 (two) times a week.    [provider]  chlorthalidone (HYGROTON) 25 MG tablet Take 25 mg by mouth daily.    [provider]  colchicine  0.6 MG tablet Take 2 tabs po now, then 1 tab po one hour later.  Then take 1 tab po  daily until joint swelling improved Patient taking differently: Take 0.6 mg by mouth See admin instructions. Take 2 tabs po now, then 1 tab po one hour later.  Then take 1 tab po daily until joint swelling improved 05/03/18   Stephania Ozell RAMAN, MD  ketorolac (ACULAR) 0.5 % ophthalmic solution Place 1 drop into the right eye 4 (four) times daily. 11/02/23   [provider]  moxifloxacin (VIGAMOX) 0.5 % ophthalmic solution Apply 1 drop to eye 4 (four) times daily. 09/27/23   [provider]  Multiple Vitamin (MULTIVITAMIN) tablet Take 1 tablet by mouth once a week.    [provider]  naproxen  (NAPROSYN ) 500 MG tablet Take 1 tablet (500 mg total) by mouth 2 (two) times daily. 02/03/18   Wieters, Hallie C, PA-C  neomycin-polymyxin b-dexamethasone (MAXITROL) 3.5-10000-0.1 OINT SMARTSIG:sparingly In Eye(s) Every Night 09/27/23   [provider]  prednisoLONE acetate (PRED FORTE) 1 % ophthalmic suspension Place 1 drop into the right eye 4 (four) times daily. 09/27/23   [provider]  predniSONE  (DELTASONE ) 10 MG tablet Take 10 mg by mouth daily.      [provider]  Vitamin D , Ergocalciferol , (DRISDOL ) 50000 UNITS CAPS capsule Take 1 capsule (50,000 Units total) by mouth every 14 (fourteen) days. 03/05/13   Rodgers Barnie RAMAN, CNM  Past Surgical History Past Surgical History:  Procedure Laterality Date   CYSTOCELE REPAIR     TOTAL HIP ARTHROPLASTY  2005   rt & left   TUBAL LIGATION  1983   Family History Family History  Problem Relation Age of Onset   Hypertension Mother    Breast cancer Sister    Hypertension Sister    Hypertension Sister    Stroke Sister    Hypertension Sister     Social History Social History   Tobacco Use   Smoking status: Never   Smokeless tobacco: Never  Substance Use Topics   Alcohol  use: No   Drug use: No   Allergies Latex and Sulfa antibiotics  Review of Systems Review of Systems  Physical Exam Vital Signs  I have reviewed the triage vital signs BP 139/80 (BP Location: Right Arm)   Pulse 82   Temp 98.9 F (37.2 C) (Oral)   Resp 17   Ht 5' 7 (1.702 m)   Wt 61.2 kg   LMP 05/01/1997   SpO2 98%   BMI 21.14 kg/m   Physical Exam Vitals and nursing note reviewed.  HENT:     Head: Normocephalic.     Mouth/Throat:     Mouth: Mucous membranes are moist.  Eyes:     Pupils: Pupils are equal, round, and reactive to light.  Cardiovascular:     Rate and Rhythm: Normal rate.  Pulmonary:     Effort: Pulmonary effort is normal.  Abdominal:     General: Abdomen is flat.     Palpations: Abdomen is soft.  Musculoskeletal:     Cervical back: Normal range of motion.     Comments: Chronic contractures in the bilateral hands  Skin:    General: Skin is warm.  Neurological:     General: No focal deficit present.     Mental Status: She is alert.     Cranial Nerves: No cranial nerve deficit.     Motor: No weakness.     Gait: Gait normal.     ED Results and Treatments Labs (all labs ordered are listed, but only abnormal results are displayed) Labs Reviewed  COMPREHENSIVE METABOLIC PANEL WITH GFR - Abnormal; Notable for the following components:      Result Value   CO2 21 (*)    Glucose, Bld 116 (*)    BUN 58 (*)    Creatinine, Ser 2.43 (*)    Total Bilirubin 1.3 (*)    GFR, Estimated 21 (*)    Anion gap 18 (*)    All other components within normal limits  CBC WITH DIFFERENTIAL/PLATELET - Abnormal; Notable for the following components:   Neutro Abs 8.4 (*)    Abs Immature Granulocytes 0.16 (*)    All other components within normal limits  TROPONIN T, HIGH SENSITIVITY - Abnormal; Notable for the following components:   Troponin T High Sensitivity 52 (*)    All other components within normal limits  URINALYSIS, W/ REFLEX TO CULTURE (INFECTION  SUSPECTED)  TROPONIN T, HIGH SENSITIVITY  Radiology CT ABDOMEN PELVIS WO CONTRAST Result Date: 03/19/2024 EXAM: CT ABDOMEN AND PELVIS WITHOUT CONTRAST 03/19/2024 02:27:02 PM TECHNIQUE: CT of the abdomen and pelvis was performed without the administration of intravenous contrast. Multiplanar reformatted images are provided for review. Automated exposure control, iterative reconstruction, and/or weight-based adjustment of the mA/kV was utilized to reduce the radiation dose to as low as reasonably achievable. COMPARISON: 04/21/2020 CLINICAL HISTORY: Abdominal pain, acute, nonlocalized. FINDINGS: LOWER CHEST: No acute abnormality. LIVER: The liver is unremarkable. GALLBLADDER AND BILE DUCTS: Gallbladder is unremarkable. No biliary ductal dilatation. SPLEEN: No acute abnormality. PANCREAS: No acute abnormality. ADRENAL GLANDS: Stable left adrenal adenoma. KIDNEYS, URETERS AND BLADDER: Bilateral renal cortical scarring is noted. No stones in the kidneys or ureters. No hydronephrosis. No perinephric or periureteral stranding. Urinary bladder is unremarkable. GI AND BOWEL: Stomach demonstrates no acute abnormality. There is no bowel obstruction. PERITONEUM AND RETROPERITONEUM: No ascites. No free air. VASCULATURE: Aorta is normal in caliber. LYMPH NODES: No lymphadenopathy. REPRODUCTIVE ORGANS: No acute abnormality. BONES AND SOFT TISSUES: Status post bilateral hip arthroplasties. No acute osseous abnormality. No focal soft tissue abnormality. IMPRESSION: 1. No acute abnormality in the abdomen or pelvis. Electronically signed by: Lynwood Seip MD 03/19/2024 02:51 PM EST RP Workstation: [id number]    Pertinent labs & imaging results that were available during my care of the patient were reviewed by me and considered in my medical decision making (see MDM for details).  Medications Ordered in  ED Medications  lactated ringers bolus 1,000 mL (1,000 mLs Intravenous New Bag/Given 03/19/24 1235)                                                                                                                                     Procedures Procedures  (including critical care time)  Medical Decision Making / ED Course   This patient presents to the ED for concern of neurolyse weakness, vomiting diarrhea, this involves an extensive number of treatment options, and is a complaint that carries with it a high risk of complications and morbidity.  The differential diagnosis includes dehydration, AKI, less likely obstruction, intra-abdominal infection, enteritis, gastroenteritis, consider ACS, consider subdural.  MDM: Patient with normal vital signs, have low suspicion for sepsis, unclear what EMS concern for sepsis was based on.  Patient afebrile, normotensive and not tachycardic.  She has soft abdomen.  I do suspect there is some degree of dehydration.  Will obtain imaging of the patient's abdomen.  Given her generalized weakness, will obtain CT imaging of the head to assess for possible subdural.  EKG and troponin ordered.  Will check urinalysis on the patient.  Reassessment 2:50 PM-patient's EKG shows left bundle branch block, negative for Smith's modified Sgarbossa criteria.  Initial troponin mildly elevated at 52, no old ones.  Patient does have chronic kidney disease with mild worsening of her renal function.  Patient still pending CT imaging, urinalysis.  Should be signed out  to Dr. Patt.   Additional history obtained:  -External records from outside source obtained and reviewed including: Chart review including previous notes, labs, imaging, consultation notes   Lab Tests: -I ordered, reviewed, and interpreted labs.   The pertinent results include:   Labs Reviewed  COMPREHENSIVE METABOLIC PANEL WITH GFR - Abnormal; Notable for the following components:      Result Value   CO2 21  (*)    Glucose, Bld 116 (*)    BUN 58 (*)    Creatinine, Ser 2.43 (*)    Total Bilirubin 1.3 (*)    GFR, Estimated 21 (*)    Anion gap 18 (*)    All other components within normal limits  CBC WITH DIFFERENTIAL/PLATELET - Abnormal; Notable for the following components:   Neutro Abs 8.4 (*)    Abs Immature Granulocytes 0.16 (*)    All other components within normal limits  TROPONIN T, HIGH SENSITIVITY - Abnormal; Notable for the following components:   Troponin T High Sensitivity 52 (*)    All other components within normal limits  URINALYSIS, W/ REFLEX TO CULTURE (INFECTION SUSPECTED)  TROPONIN T, HIGH SENSITIVITY      EKG left bundle branch block, no acute ischemia.  EKG Interpretation Date/Time:  Wednesday March 19 2024 12:00:55 EST Ventricular Rate:  85 PR Interval:  117 QRS Duration:  151 QT Interval:  425 QTC Calculation: 509 R Axis:   -11  Text Interpretation: Sinus rhythm Borderline short PR interval Left bundle branch block Interpretation limited secondary to artifact Confirmed by Mannie Pac [phone]) on 03/19/2024 1:42:58 PM        Medicines ordered and prescription drug management: Meds ordered this encounter  Medications   lactated ringers bolus 1,000 mL    -I have reviewed the patients home medicines and have made adjustments as needed  Cardiac Monitoring: The patient was maintained on a cardiac monitor.  I personally viewed and interpreted the cardiac monitored which showed an underlying rhythm of: Normal sinus rhythm  Social Determinants of Health:  Factors impacting patients care include: Access to primary care   Reevaluation: After the interventions noted above, I reevaluated the patient and found that they have :improved  Co morbidities that complicate the patient evaluation  Past Medical History:  Diagnosis Date   Anemia    Gout    rt foot   HPV (human papilloma virus) infection    negative genotype   Hypertension    Lupus     Osteoarthritis    Osteoporosis        Final Clinical Impression(s) / ED Diagnoses Final diagnoses:  Weakness     @PCDICTATION @    Mannie Pac T, DO 03/19/24 1458

## 2024-03-19 NOTE — ED Notes (Signed)
 Inc care. Boosted up in bed. All needs met.

## 2024-03-19 NOTE — Discharge Instructions (Addendum)
 As we discussed, you likely have urinary tract infection and mild dehydration  I have prescribed Keflex  3 times daily for 5 days  Please stay hydrated  See your doctor for follow-up  Return to ER if you have worse chest pain or abdominal pain or vomiting or fevers

## 2024-03-19 NOTE — ED Provider Notes (Signed)
  Physical Exam  BP 111/67   Pulse 79   Temp 97.7 F (36.5 C) (Oral)   Resp 12   Ht 5' 7 (1.702 m)   Wt 61.2 kg   LMP 05/01/1997   SpO2 100%   BMI 21.14 kg/m   Physical Exam  Procedures  Procedures  ED Course / MDM    Medical Decision Making Care assumed at 3 PM.  Patient is here with chest pain and abdominal cramps after eating at [employer].  Initial troponin was 52 and signed out pending repeat troponin and CT abdomen pelvis and urinalysis.  7:05 PM I reviewed patient's labs and repeat troponin went down to 45.  Patient received IV fluids and is able to tolerate p.o.  I reviewed the CT abdomen pelvis and it was unremarkable.  Patient does have greater than 50 white blood cells in her urinalysis.  Patient does have some dysuria and frequency.  Patient given Rocephin will discharge home with Keflex .  Problems Addressed: Acute cystitis without hematuria: acute illness or injury Weakness: acute illness or injury  Amount and/or Complexity of Data Reviewed Labs: ordered. Decision-making details documented in ED Course. Radiology: ordered and independent interpretation performed. Decision-making details documented in ED Course.          Patt Alm Macho, MD 03/19/24 [phone]

## 2024-03-19 NOTE — ED Triage Notes (Signed)
 Pt BIB ems for having weakness for 1 week now. Hx of UTIs. Not diabetic, denies N/V/D, no recent falls. A&Ox4.

## 2024-04-01 ENCOUNTER — Ambulatory Visit: Admitting: Podiatry
